# Patient Record
Sex: Female | Born: 1980 | Hispanic: No | Marital: Married
Health system: Southern US, Community
[De-identification: ages and names within clinical notes are randomized; demographics above are authoritative.]

## PROBLEM LIST (undated history)

## (undated) DIAGNOSIS — B1920 Unspecified viral hepatitis C without hepatic coma: Secondary | ICD-10-CM

## (undated) DIAGNOSIS — F329 Major depressive disorder, single episode, unspecified: Secondary | ICD-10-CM

## (undated) DIAGNOSIS — I10 Essential (primary) hypertension: Secondary | ICD-10-CM

## (undated) DIAGNOSIS — T8859XA Other complications of anesthesia, initial encounter: Secondary | ICD-10-CM

## (undated) DIAGNOSIS — T4145XA Adverse effect of unspecified anesthetic, initial encounter: Secondary | ICD-10-CM

## (undated) DIAGNOSIS — F32A Depression, unspecified: Secondary | ICD-10-CM

## (undated) DIAGNOSIS — M1990 Unspecified osteoarthritis, unspecified site: Secondary | ICD-10-CM

## (undated) DIAGNOSIS — K5903 Drug induced constipation: Secondary | ICD-10-CM

## (undated) DIAGNOSIS — F419 Anxiety disorder, unspecified: Secondary | ICD-10-CM

## (undated) DIAGNOSIS — K219 Gastro-esophageal reflux disease without esophagitis: Secondary | ICD-10-CM

## (undated) HISTORY — PX: ABDOMINAL HYSTERECTOMY: SHX81

## (undated) HISTORY — PX: CHOLECYSTECTOMY: SHX55

---

## 1998-01-18 ENCOUNTER — Emergency Department (HOSPITAL_COMMUNITY): Admission: EM | Admit: 1998-01-18 | Discharge: 1998-01-18 | Payer: Self-pay | Admitting: Emergency Medicine

## 1999-07-19 ENCOUNTER — Emergency Department (HOSPITAL_COMMUNITY): Admission: EM | Admit: 1999-07-19 | Discharge: 1999-07-19 | Payer: Self-pay

## 2000-05-18 ENCOUNTER — Encounter: Payer: Self-pay | Admitting: Emergency Medicine

## 2000-05-18 ENCOUNTER — Emergency Department (HOSPITAL_COMMUNITY): Admission: EM | Admit: 2000-05-18 | Discharge: 2000-05-19 | Payer: Self-pay | Admitting: Emergency Medicine

## 2000-05-24 ENCOUNTER — Emergency Department (HOSPITAL_COMMUNITY): Admission: EM | Admit: 2000-05-24 | Discharge: 2000-05-24 | Payer: Self-pay | Admitting: Emergency Medicine

## 2000-09-13 ENCOUNTER — Emergency Department (HOSPITAL_COMMUNITY): Admission: EM | Admit: 2000-09-13 | Discharge: 2000-09-13 | Payer: Self-pay | Admitting: Emergency Medicine

## 2000-11-24 ENCOUNTER — Encounter: Payer: Self-pay | Admitting: Emergency Medicine

## 2000-11-24 ENCOUNTER — Emergency Department (HOSPITAL_COMMUNITY): Admission: EM | Admit: 2000-11-24 | Discharge: 2000-11-24 | Payer: Self-pay | Admitting: Emergency Medicine

## 2001-01-18 ENCOUNTER — Inpatient Hospital Stay (HOSPITAL_COMMUNITY): Admission: AD | Admit: 2001-01-18 | Discharge: 2001-01-18 | Payer: Self-pay | Admitting: *Deleted

## 2001-04-28 ENCOUNTER — Inpatient Hospital Stay (HOSPITAL_COMMUNITY): Admission: AD | Admit: 2001-04-28 | Discharge: 2001-04-30 | Payer: Self-pay | Admitting: Obstetrics & Gynecology

## 2001-07-02 ENCOUNTER — Encounter: Payer: Self-pay | Admitting: Emergency Medicine

## 2001-07-02 ENCOUNTER — Emergency Department (HOSPITAL_COMMUNITY): Admission: EM | Admit: 2001-07-02 | Discharge: 2001-07-02 | Payer: Self-pay | Admitting: Emergency Medicine

## 2002-03-09 ENCOUNTER — Emergency Department (HOSPITAL_COMMUNITY): Admission: EM | Admit: 2002-03-09 | Discharge: 2002-03-09 | Payer: Self-pay | Admitting: Emergency Medicine

## 2002-04-04 ENCOUNTER — Encounter: Payer: Self-pay | Admitting: Emergency Medicine

## 2002-04-04 ENCOUNTER — Observation Stay (HOSPITAL_COMMUNITY): Admission: EM | Admit: 2002-04-04 | Discharge: 2002-04-05 | Payer: Self-pay | Admitting: Emergency Medicine

## 2002-04-04 ENCOUNTER — Emergency Department (HOSPITAL_COMMUNITY): Admission: EM | Admit: 2002-04-04 | Discharge: 2002-04-04 | Payer: Self-pay | Admitting: Emergency Medicine

## 2005-07-12 ENCOUNTER — Encounter: Payer: Self-pay | Admitting: Emergency Medicine

## 2005-07-13 ENCOUNTER — Inpatient Hospital Stay (HOSPITAL_COMMUNITY): Admission: AD | Admit: 2005-07-13 | Discharge: 2005-07-14 | Payer: Self-pay | Admitting: *Deleted

## 2005-11-02 ENCOUNTER — Ambulatory Visit (HOSPITAL_COMMUNITY): Admission: RE | Admit: 2005-11-02 | Discharge: 2005-11-02 | Payer: Self-pay | Admitting: Internal Medicine

## 2005-11-09 ENCOUNTER — Ambulatory Visit: Payer: Self-pay | Admitting: Gynecology

## 2005-11-23 ENCOUNTER — Ambulatory Visit: Payer: Self-pay | Admitting: Gynecology

## 2005-11-30 ENCOUNTER — Ambulatory Visit: Payer: Self-pay | Admitting: Gynecology

## 2012-08-29 ENCOUNTER — Emergency Department (HOSPITAL_COMMUNITY): Payer: Self-pay

## 2012-08-29 ENCOUNTER — Emergency Department (HOSPITAL_COMMUNITY)
Admission: EM | Admit: 2012-08-29 | Discharge: 2012-08-29 | Disposition: A | Discharge status: Home / Self Care | Payer: Self-pay | Attending: Emergency Medicine | Admitting: Emergency Medicine

## 2012-08-29 ENCOUNTER — Encounter (HOSPITAL_COMMUNITY): Payer: Self-pay | Admitting: Emergency Medicine

## 2012-08-29 DIAGNOSIS — Z8619 Personal history of other infectious and parasitic diseases: Secondary | ICD-10-CM | POA: Insufficient documentation

## 2012-08-29 DIAGNOSIS — R059 Cough, unspecified: Secondary | ICD-10-CM | POA: Insufficient documentation

## 2012-08-29 DIAGNOSIS — R0602 Shortness of breath: Secondary | ICD-10-CM | POA: Insufficient documentation

## 2012-08-29 DIAGNOSIS — Z79899 Other long term (current) drug therapy: Secondary | ICD-10-CM | POA: Insufficient documentation

## 2012-08-29 DIAGNOSIS — M549 Dorsalgia, unspecified: Secondary | ICD-10-CM | POA: Insufficient documentation

## 2012-08-29 DIAGNOSIS — F17200 Nicotine dependence, unspecified, uncomplicated: Secondary | ICD-10-CM | POA: Insufficient documentation

## 2012-08-29 DIAGNOSIS — IMO0002 Reserved for concepts with insufficient information to code with codable children: Secondary | ICD-10-CM | POA: Insufficient documentation

## 2012-08-29 HISTORY — DX: Unspecified viral hepatitis C without hepatic coma: B19.20

## 2012-08-29 MED ORDER — OXYCODONE-ACETAMINOPHEN 5-325 MG PO TABS
2.0000 | ORAL_TABLET | Freq: Once | ORAL | Status: AC
  Administered 2012-08-29: 2 via ORAL
  Filled 2012-08-29: qty 2

## 2012-08-29 MED ORDER — OXYCODONE-ACETAMINOPHEN 5-325 MG PO TABS: 2.0000 | ORAL_TABLET | ORAL | Status: DC | PRN

## 2012-08-29 MED ORDER — KETOROLAC TROMETHAMINE 60 MG/2ML IM SOLN
60.0000 mg | Freq: Once | INTRAMUSCULAR | Status: AC
  Administered 2012-08-29: 60 mg via INTRAMUSCULAR
  Filled 2012-08-29: qty 2

## 2012-08-29 NOTE — ED Notes (Signed)
Pt complains of right knee pain x 1 month. Pt denies any injury to area.

## 2012-08-29 NOTE — ED Notes (Signed)
MD at bedside. 

## 2012-08-29 NOTE — ED Provider Notes (Signed)
History     CSN: [id number]  Arrival date & time 08/29/12  1507   First MD Initiated Contact with Patient 08/29/12 1541      Chief Complaint  Patient presents with  . Knee Pain    (Consider location/radiation/quality/duration/timing/severity/associated sxs/prior treatment) Patient is a 32 y.o. female presenting with knee pain. The history is provided by the patient and a relative. No language interpreter was used.  Knee Pain Location:  Knee Time since incident:  12 months Injury: no   Pain details:    Quality:  Aching   Onset quality:  Gradual   Duration: several years while in jail.   Timing:  Constant   Progression:  Worsening Chronicity:  Recurrent Associated symptoms: back pain   Associated symptoms: no fever   31yo female with c/o R knee pain x several years that has worsened in the last 2 weeks.  Increased pain with bending and ambulating. She has taken nothing for the pain.  Remote history of dislocating her knee in prison 7 years ago.  Worried because no insurance or money to pay.  Also states that she gets SOB with exertion and has a cough that has been intermittant x several months. Denies chest pain or SOB at rest.  States in prison she had "spots on her lungs".  No long trips.  No calf pain.  Repeat HR 90.  No chest pain.  pmh smoker, morbidly obese, hep c.  Can not take ibuprofen (stomach ache) or tylenol (hep c).  No pcp.    Past Medical History  Diagnosis Date  . Hepatitis C     History reviewed. No pertinent past surgical history.  No family history on file.  History  Substance Use Topics  . Smoking status: Current Every Day Smoker -- 0.50 packs/day    Types: Cigarettes  . Smokeless tobacco: Not on file  . Alcohol Use: No    OB History   Grav Para Term Preterm Abortions TAB SAB Ect Mult Living                  Review of Systems  Constitutional: Negative.  Negative for fever.  HENT: Negative.   Eyes: Negative.   Respiratory: Positive for cough  and shortness of breath. Negative for chest tightness and wheezing.   Cardiovascular: Negative.  Negative for chest pain, palpitations and leg swelling.  Gastrointestinal: Negative.  Negative for nausea and vomiting.  Musculoskeletal: Positive for back pain, arthralgias and gait problem.       Knee pain.  Neurological: Negative.   Psychiatric/Behavioral: Negative.   All other systems reviewed and are negative.    Allergies  Motrin  Home Medications   Current Outpatient Rx  Name  Route  Sig  Dispense  Refill  . omeprazole (PRILOSEC) 20 MG capsule   Oral   Take 20 mg by mouth daily.           BP 117/90  Pulse 90  Temp(Src) 98.3 F (36.8 C) (Oral)  SpO2 99%  LMP 08/02/2012  Physical Exam  Nursing note and vitals reviewed. Constitutional: She is oriented to person, place, and time. She appears well-developed and well-nourished.  HENT:  Head: Normocephalic and atraumatic.  Eyes: Conjunctivae and EOM are normal. Pupils are equal, round, and reactive to light.  Neck: Normal range of motion. Neck supple.  Cardiovascular: Normal rate, regular rhythm, normal heart sounds and intact distal pulses.  Exam reveals no gallop and no friction rub.   No murmur heard.  Pulmonary/Chest: Effort normal and breath sounds normal.  Abdominal: Soft. Bowel sounds are normal.  Musculoskeletal: Normal range of motion. She exhibits tenderness. She exhibits no edema.  R knee tenderness with palpatation to patella superiorly.  No calf pain, swelling.  Skin cool to touch.  Neurological: She is alert and oriented to person, place, and time. She has normal reflexes.  Skin: Skin is warm and dry.  Psychiatric: She has a normal mood and affect.    ED Course  Procedures (including critical care time)  Labs Reviewed - No data to display Dg Chest 2 View  08/29/2012  *RADIOLOGY REPORT*  Clinical Data: Shortness of breath.  CHEST - 2 VIEW  Comparison: None.  Findings: Trachea is midline.  Heart size  normal.  Minimal scarring is seen anteriorly on the lateral view.  Lungs are otherwise clear. No pleural fluid.  IMPRESSION: No acute findings.   Original Report Authenticated By: Leanna Battles, M.D.    Dg Knee Complete 4 Views Right  08/29/2012  *RADIOLOGY REPORT*  Clinical Data: Anterior knee pain.  No known injury.  RIGHT KNEE - COMPLETE 4+ VIEW  Comparison: None.  Findings: No acute bony or joint abnormality is identified.  There is degenerative change about the knee which is advanced for age and appears worst in the patellofemoral and medial compartments.  There appear to be several loose bodies in the medial compartment.  IMPRESSION:  1.  No acute finding.  2.  Advanced for age tricompartmental degenerative change, worst in the patellofemoral and medial compartments.   Original Report Authenticated By: Holley Dexter, M.D.      No diagnosis found.    MDM  R knee pain with cough.  Knee film - for acute findings.  Morbidly obese with degenerative changes.  Crutches provided.  Cough x 1 month and SOB with exertion x several months.  No calf pain, chest pain or sob at rest.  Chest x-ray unremarkable. Hypertensive initially with small cuff.  She will follow up with ortho. She understands to return for chest pain or SOB worsening.         Remi Haggard, NP 08/29/12 2330

## 2012-08-29 NOTE — ED Notes (Signed)
Patient transported to X-ray 

## 2012-08-30 NOTE — ED Provider Notes (Signed)
Medical screening examination/treatment/procedure(s) were performed by non-physician practitioner and as supervising physician I was immediately available for consultation/collaboration.  Amonda Brillhart R. Stachia Slutsky, MD 08/30/12 0012 

## 2012-09-25 ENCOUNTER — Encounter (HOSPITAL_COMMUNITY): Payer: Self-pay | Admitting: *Deleted

## 2012-09-25 ENCOUNTER — Emergency Department (HOSPITAL_COMMUNITY)
Admission: EM | Admit: 2012-09-25 | Discharge: 2012-09-25 | Disposition: A | Discharge status: Home / Self Care | Payer: Self-pay | Attending: Emergency Medicine | Admitting: Emergency Medicine

## 2012-09-25 DIAGNOSIS — F17200 Nicotine dependence, unspecified, uncomplicated: Secondary | ICD-10-CM | POA: Insufficient documentation

## 2012-09-25 DIAGNOSIS — R112 Nausea with vomiting, unspecified: Secondary | ICD-10-CM | POA: Insufficient documentation

## 2012-09-25 DIAGNOSIS — Z8619 Personal history of other infectious and parasitic diseases: Secondary | ICD-10-CM | POA: Insufficient documentation

## 2012-09-25 DIAGNOSIS — R1110 Vomiting, unspecified: Secondary | ICD-10-CM

## 2012-09-25 DIAGNOSIS — R51 Headache: Secondary | ICD-10-CM | POA: Insufficient documentation

## 2012-09-25 DIAGNOSIS — M25569 Pain in unspecified knee: Secondary | ICD-10-CM | POA: Insufficient documentation

## 2012-09-25 LAB — COMPREHENSIVE METABOLIC PANEL
AST: 28 U/L (ref 0–37)
CO2: 24 mEq/L (ref 19–32)
Calcium: 9.2 mg/dL (ref 8.4–10.5)
Creatinine, Ser: 0.76 mg/dL (ref 0.50–1.10)
GFR calc Af Amer: >90 mL/min (ref >90)
GFR calc non Af Amer: >90 mL/min (ref >90)
Glucose, Bld: 93 mg/dL (ref 70–99)
Sodium: 135 mEq/L (ref 135–145)
Total Protein: 8.1 g/dL (ref 6.0–8.3)

## 2012-09-25 LAB — URINALYSIS, ROUTINE W REFLEX MICROSCOPIC
Ketones, ur: NEGATIVE mg/dL
Nitrite: NEGATIVE
Protein, ur: NEGATIVE mg/dL

## 2012-09-25 LAB — CBC WITH DIFFERENTIAL/PLATELET
Basophils Absolute: 0 10*3/uL (ref 0.0–0.1)
Eosinophils Absolute: 0 10*3/uL (ref 0.0–0.7)
Eosinophils Relative: 0 % (ref 0–5)
HCT: 43.2 % (ref 36.0–46.0)
Lymphocytes Relative: 31 % (ref 12–46)
MCH: 26.3 pg (ref 26.0–34.0)
MCV: 80.4 fL (ref 78.0–100.0)
Monocytes Absolute: 0.7 10*3/uL (ref 0.1–1.0)
Platelets: 289 10*3/uL (ref 150–400)
RDW: 13.9 % (ref 11.5–15.5)
WBC: 11.3 10*3/uL — ABNORMAL HIGH (ref 4.0–10.5)

## 2012-09-25 LAB — POCT PREGNANCY, URINE: Preg Test, Ur: NEGATIVE

## 2012-09-25 LAB — URINE MICROSCOPIC-ADD ON

## 2012-09-25 MED ORDER — PROMETHAZINE HCL 25 MG PO TABS: 25.0000 mg | ORAL_TABLET | Freq: Four times a day (QID) | ORAL | Status: DC | PRN

## 2012-09-25 MED ORDER — SODIUM CHLORIDE 0.9 % IV BOLUS (SEPSIS)
1000.0000 mL | Freq: Once | INTRAVENOUS | Status: AC
  Administered 2012-09-25: 1000 mL via INTRAVENOUS

## 2012-09-25 MED ORDER — ONDANSETRON HCL 4 MG/2ML IJ SOLN
4.0000 mg | Freq: Once | INTRAMUSCULAR | Status: AC
  Administered 2012-09-25: 4 mg via INTRAVENOUS
  Filled 2012-09-25: qty 2

## 2012-09-25 MED ORDER — TRAMADOL HCL 50 MG PO TABS: 50.0000 mg | ORAL_TABLET | Freq: Four times a day (QID) | ORAL | Status: DC | PRN

## 2012-09-25 NOTE — ED Provider Notes (Signed)
History     CSN: [id number]  Arrival date & time 09/25/12  1433   First MD Initiated Contact with Patient 09/25/12 1734      Chief Complaint  Patient presents with  . Knee Pain  . Emesis    (Consider location/radiation/quality/duration/timing/severity/associated sxs/prior treatment) HPI Comments: Patient comes to the ER for evaluation of nausea and vomiting. She denies abdominal pain. She has not had fever. There has not been any diarrhea. Patient reports, however, that she has not been able to hold anything down for 2 days. She has had associated mild headache. No neck pain or stiffness.  Patient also concerned about bilateral knee pain. She was seen in the ER a month ago and had an x-ray of the right knee. She reports that she was told she has arthritis, needed to see orthopedics. She reports that she has no insurance, has not been able to followup.  Patient is a 32 y.o. female presenting with knee pain and vomiting.  Knee Pain Associated symptoms: no fever   Emesis Associated symptoms: arthralgias and headaches   Associated symptoms: no abdominal pain and no diarrhea     Past Medical History  Diagnosis Date  . Hepatitis C     Past Surgical History  Procedure Laterality Date  . Cholecystectomy      No family history on file.  History  Substance Use Topics  . Smoking status: Current Every Day Smoker -- 0.50 packs/day    Types: Cigarettes  . Smokeless tobacco: Not on file  . Alcohol Use: No    OB History   Grav Para Term Preterm Abortions TAB SAB Ect Mult Living                  Review of Systems  Constitutional: Negative for fever.  Gastrointestinal: Positive for nausea and vomiting. Negative for abdominal pain and diarrhea.  Musculoskeletal: Positive for arthralgias.  Neurological: Positive for headaches.  All other systems reviewed and are negative.    Allergies  Motrin  Home Medications   Current Outpatient Rx  Name  Route  Sig  Dispense   Refill  . omeprazole (PRILOSEC) 20 MG capsule   Oral   Take 20 mg by mouth daily.           BP 146/99  Pulse 81  Temp(Src) 98.1 F (36.7 C) (Oral)  Resp 18  SpO2 100%  LMP 08/02/2012  Physical Exam  Constitutional: She is oriented to person, place, and time. She appears well-developed and well-nourished. No distress.  HENT:  Head: Normocephalic and atraumatic.  Right Ear: Hearing normal.  Nose: Nose normal.  Mouth/Throat: Oropharynx is clear and moist and mucous membranes are normal.  Eyes: Conjunctivae and EOM are normal. Pupils are equal, round, and reactive to light.  Neck: Normal range of motion. Neck supple.  Cardiovascular: Normal rate, regular rhythm, S1 normal and S2 normal.  Exam reveals no gallop and no friction rub.   No murmur heard. Pulmonary/Chest: Effort normal and breath sounds normal. No respiratory distress. She exhibits no tenderness.  Abdominal: Soft. Normal appearance and bowel sounds are normal. There is no hepatosplenomegaly. There is no tenderness. There is no rebound, no guarding, no tenderness at McBurney's point and negative Murphy's sign. No hernia.  Musculoskeletal: Normal range of motion.  Neurological: She is alert and oriented to person, place, and time. She has normal strength. No cranial nerve deficit or sensory deficit. Coordination normal. GCS eye subscore is 4. GCS verbal subscore is 5. GCS  motor subscore is 6.  Skin: Skin is warm, dry and intact. No rash noted. No cyanosis.  Psychiatric: She has a normal mood and affect. Her speech is normal and behavior is normal. Thought content normal.    ED Course  Procedures (including critical care time)  Labs Reviewed  CBC WITH DIFFERENTIAL - Abnormal; Notable for the following:    WBC 11.3 (*)    RBC 5.37 (*)    All other components within normal limits  COMPREHENSIVE METABOLIC PANEL - Abnormal; Notable for the following:    ALT 41 (*)    All other components within normal limits   URINALYSIS, ROUTINE W REFLEX MICROSCOPIC - Abnormal; Notable for the following:    APPearance CLOUDY (*)    Leukocytes, UA SMALL (*)    All other components within normal limits  URINE MICROSCOPIC-ADD ON - Abnormal; Notable for the following:    Squamous Epithelial / LPF MANY (*)    Bacteria, UA FEW (*)    All other components within normal limits  URINE CULTURE  LIPASE, BLOOD  CBC WITH DIFFERENTIAL  POCT PREGNANCY, URINE   No results found.   Diagnoses: 1. Vomiting 2. Bilateral knee arthritis    MDM  Patient comes to the ER for evaluation of nausea and vomiting. She is not experiencing abdominal pain and abdominal exam is benign. Lab work was unremarkable. Patient was hydrated and administered Zofran here in the ER. She'll be discharged with continued antibiotics.  Patient also complaining of bilateral knee pain. No swelling, redness or warmth. No concern for septic joints. Patient already diagnosed with osteoarthritis. Will recommend NSAIDs to use as needed.        Gilda Crease, MD 09/25/12 2030

## 2012-09-25 NOTE — ED Notes (Signed)
Pt here for multiple c/o chronic knee pain and emesis.  Pt was seen at [facility] for R knee pain and was told she had some "deterioration".  Was referred to orthopedic MD, but has not ins.  Since then pt now has same pain in both L and R knees.    Pt also c/o emesis and headache x 2 days. Denies diarrhea, abd pain and fevers.

## 2012-09-27 LAB — URINE CULTURE: Colony Count: >=100000

## 2012-12-24 ENCOUNTER — Emergency Department (HOSPITAL_COMMUNITY)
Admission: EM | Admit: 2012-12-24 | Discharge: 2012-12-24 | Disposition: A | Discharge status: Home / Self Care | Payer: Self-pay | Attending: Emergency Medicine | Admitting: Emergency Medicine

## 2012-12-24 DIAGNOSIS — IMO0002 Reserved for concepts with insufficient information to code with codable children: Secondary | ICD-10-CM | POA: Insufficient documentation

## 2012-12-24 DIAGNOSIS — M170 Bilateral primary osteoarthritis of knee: Secondary | ICD-10-CM

## 2012-12-24 DIAGNOSIS — Z79899 Other long term (current) drug therapy: Secondary | ICD-10-CM | POA: Insufficient documentation

## 2012-12-24 DIAGNOSIS — F17200 Nicotine dependence, unspecified, uncomplicated: Secondary | ICD-10-CM | POA: Insufficient documentation

## 2012-12-24 DIAGNOSIS — M722 Plantar fascial fibromatosis: Secondary | ICD-10-CM | POA: Insufficient documentation

## 2012-12-24 DIAGNOSIS — M1710 Unilateral primary osteoarthritis, unspecified knee: Secondary | ICD-10-CM | POA: Insufficient documentation

## 2012-12-24 DIAGNOSIS — Z8619 Personal history of other infectious and parasitic diseases: Secondary | ICD-10-CM | POA: Insufficient documentation

## 2012-12-24 MED ORDER — CYCLOBENZAPRINE HCL 10 MG PO TABS: 10.0000 mg | ORAL_TABLET | Freq: Two times a day (BID) | ORAL | Status: DC | PRN

## 2012-12-24 MED ORDER — TRAMADOL HCL 50 MG PO TABS: 50.0000 mg | ORAL_TABLET | Freq: Four times a day (QID) | ORAL | Status: DC | PRN

## 2012-12-24 NOTE — ED Provider Notes (Signed)
History  This chart was scribed for non-physician practitioner working with Gerhard Munch, MD by Greggory Stallion, ED scribe. This patient was seen in room WTR6/WTR6 and the patient's care was started at 6:30 PM.  CSN: [id number]  Arrival date & time 12/24/12  1824    No chief complaint on file.   Patient is a 32 y.o. female presenting with knee pain. The history is provided by the patient. No language interpreter was used.  Knee Pain Location:  Hip and knee Time since incident:  4 days Injury: no   Hip location:  L hip Knee location:  L knee Pain details:    Severity:  Moderate   Onset quality:  Gradual   Duration:  4 days   Timing:  Constant Chronicity:  New Dislocation: no   Relieved by:  Nothing Ineffective treatments: previous pain medication prescribed. Associated symptoms: no back pain, no fever and no neck pain     HPI Comments: Cheryl Fox is a 32 y.o. Morbidly obese female with h/o plantar fasciitis who presents to the Emergency Department complaining of gradual onset, gradually worsening bilateral knee pain that radiated from hips down to knees that started 4 days ago. Pt states the pain is worse in her left knee. Pt states she was told 4 months ago that her knee is deteriorating. She states the pain has gotten worse over the last 4 days. She states she has left heal pain with intermittent numbness. Pt states she has taken pain pills prescribed to her previously with no relief. Pt denies ankle pain, fever, neck pain, sore throat, visual disturbance, CP, cough, SOB, abdominal pain, nausea, emesis, diarrhea, urinary symptoms, back pain, HA, weakness, numbness and rash as associated symptoms.  No recent trauma.  Past Medical History  Diagnosis Date  . Hepatitis C     Past Surgical History  Procedure Laterality Date  . Cholecystectomy      No family history on file.  History  Substance Use Topics  . Smoking status: Current Every Day Smoker -- 0.50 packs/day   Types: Cigarettes  . Smokeless tobacco: Not on file  . Alcohol Use: No    OB History   Grav Para Term Preterm Abortions TAB SAB Ect Mult Living                  Review of Systems  Constitutional: Negative for fever.  HENT: Negative for sore throat and neck pain.   Eyes: Negative for visual disturbance.  Respiratory: Negative for cough and shortness of breath.   Cardiovascular: Negative for chest pain.  Gastrointestinal: Negative for nausea, vomiting and diarrhea.  Genitourinary: Negative for difficulty urinating.  Musculoskeletal: Negative for back pain.  Skin: Negative for rash.  Neurological: Negative for headaches.  All other systems reviewed and are negative.    Allergies  Motrin  Home Medications   Current Outpatient Rx  Name  Route  Sig  Dispense  Refill  . omeprazole (PRILOSEC) 20 MG capsule   Oral   Take 20 mg by mouth daily.         . promethazine (PHENERGAN) 25 MG tablet   Oral   Take 1 tablet (25 mg total) by mouth every 6 (six) hours as needed for nausea.   30 tablet   0   . traMADol (ULTRAM) 50 MG tablet   Oral   Take 1 tablet (50 mg total) by mouth every 6 (six) hours as needed for pain.   15 tablet   0  BP 140/104  Pulse 101  Temp(Src) 98.6 F (37 C) (Oral)  Resp 16  SpO2 98%  Physical Exam  Nursing note and vitals reviewed. Constitutional: She is oriented to person, place, and time. She appears well-developed and well-nourished. No distress.  HENT:  Head: Normocephalic and atraumatic.  Eyes: EOM are normal.  Neck: Neck supple. No tracheal deviation present.  Cardiovascular: Normal rate.   Pulmonary/Chest: Effort normal. No respiratory distress.  Musculoskeletal: Normal range of motion.  Normal appearence to both knees. Tenderness to palpation to interior and inferior aspects of left knee below the patella without deformity, edema, crepitance, or overlying skin changes. Normal knee flexion. No joint laxity. Normal ankle and hip.  Left foot with tenderness around the peroneal region and sole foot without any overlying changes. Increased pain with foot dorsal flexion.   Neurological: She is alert and oriented to person, place, and time.  Skin: Skin is warm and dry. No rash noted.  Psychiatric: She has a normal mood and affect. Her behavior is normal.    ED Course  Procedures (including critical care time)  DIAGNOSTIC STUDIES: Oxygen Saturation is 98% on RA, normal by my interpretation.    COORDINATION OF CARE: 7:01 PM-Discussed treatment plan which includes warm and cool compress, pain medication, and resting the left knee with pt at bedside and pt agreed to plan. Alerted pt that there was no sign of infection of the knee joint.  Labs Reviewed - No data to display No results found.   1. Osteoarthritis of both knees   2. Plantar fasciitis, left       MDM  BP 140/104  Pulse 101  Temp(Src) 98.6 F (37 C) (Oral)  Resp 16  SpO2 98%   I personally performed the services described in this documentation, which was scribed in my presence. The recorded information has been reviewed and is accurate.    Fayrene Helper, PA-C 12/24/12 1911

## 2012-12-24 NOTE — ED Provider Notes (Signed)
  Medical screening examination/treatment/procedure(s) were performed by non-physician practitioner and as supervising physician I was immediately available for consultation/collaboration.    Gerhard Munch, MD 12/24/12 [phone]

## 2012-12-24 NOTE — ED Notes (Signed)
Pt c/o bilateral knee pain, worse in L knee. Pt states pain radiates from hips down to knees. Pt states pain has been worse over the past 4 days. Pt also c/o pain to L heel and numbness to L heel. Pt ambulatory to exam room with steady gait.

## 2013-08-05 ENCOUNTER — Emergency Department (HOSPITAL_COMMUNITY): Payer: Self-pay

## 2013-08-05 ENCOUNTER — Encounter (HOSPITAL_COMMUNITY): Payer: Self-pay | Admitting: Emergency Medicine

## 2013-08-05 ENCOUNTER — Emergency Department (HOSPITAL_COMMUNITY)
Admission: EM | Admit: 2013-08-05 | Discharge: 2013-08-06 | Disposition: A | Discharge status: Home / Self Care | Payer: Self-pay | Attending: Emergency Medicine | Admitting: Emergency Medicine

## 2013-08-05 DIAGNOSIS — X58XXXA Exposure to other specified factors, initial encounter: Secondary | ICD-10-CM | POA: Insufficient documentation

## 2013-08-05 DIAGNOSIS — R000 Tachycardia, unspecified: Secondary | ICD-10-CM | POA: Insufficient documentation

## 2013-08-05 DIAGNOSIS — IMO0002 Reserved for concepts with insufficient information to code with codable children: Secondary | ICD-10-CM | POA: Insufficient documentation

## 2013-08-05 DIAGNOSIS — R071 Chest pain on breathing: Secondary | ICD-10-CM | POA: Insufficient documentation

## 2013-08-05 DIAGNOSIS — F17200 Nicotine dependence, unspecified, uncomplicated: Secondary | ICD-10-CM | POA: Insufficient documentation

## 2013-08-05 DIAGNOSIS — M542 Cervicalgia: Secondary | ICD-10-CM

## 2013-08-05 DIAGNOSIS — Z79899 Other long term (current) drug therapy: Secondary | ICD-10-CM | POA: Insufficient documentation

## 2013-08-05 DIAGNOSIS — K219 Gastro-esophageal reflux disease without esophagitis: Secondary | ICD-10-CM | POA: Insufficient documentation

## 2013-08-05 DIAGNOSIS — T148XXA Other injury of unspecified body region, initial encounter: Secondary | ICD-10-CM

## 2013-08-05 DIAGNOSIS — M546 Pain in thoracic spine: Secondary | ICD-10-CM | POA: Insufficient documentation

## 2013-08-05 DIAGNOSIS — Z880 Allergy status to penicillin: Secondary | ICD-10-CM | POA: Insufficient documentation

## 2013-08-05 DIAGNOSIS — Y929 Unspecified place or not applicable: Secondary | ICD-10-CM | POA: Insufficient documentation

## 2013-08-05 DIAGNOSIS — Y939 Activity, unspecified: Secondary | ICD-10-CM | POA: Insufficient documentation

## 2013-08-05 DIAGNOSIS — R0789 Other chest pain: Secondary | ICD-10-CM

## 2013-08-05 DIAGNOSIS — Z8619 Personal history of other infectious and parasitic diseases: Secondary | ICD-10-CM | POA: Insufficient documentation

## 2013-08-05 HISTORY — DX: Gastro-esophageal reflux disease without esophagitis: K21.9

## 2013-08-05 LAB — CBC
HCT: 42.4 % (ref 36.0–46.0)
Hemoglobin: 13.9 g/dL (ref 12.0–15.0)
MCH: 26.6 pg (ref 26.0–34.0)
MCHC: 32.8 g/dL (ref 30.0–36.0)
MCV: 81.1 fL (ref 78.0–100.0)
Platelets: 287 10*3/uL (ref 150–400)
RBC: 5.23 MIL/uL — ABNORMAL HIGH (ref 3.87–5.11)
RDW: 13.9 % (ref 11.5–15.5)
WBC: 11.3 10*3/uL — ABNORMAL HIGH (ref 4.0–10.5)

## 2013-08-05 LAB — BASIC METABOLIC PANEL
BUN: 11 mg/dL (ref 6–23)
CO2: 23 mEq/L (ref 19–32)
Calcium: 9.1 mg/dL (ref 8.4–10.5)
Chloride: 104 mEq/L (ref 96–112)
Creatinine, Ser: 0.89 mg/dL (ref 0.50–1.10)
GFR calc Af Amer: >90 mL/min (ref >90)
GFR calc non Af Amer: 84 mL/min — ABNORMAL LOW (ref >90)
Glucose, Bld: 149 mg/dL — ABNORMAL HIGH (ref 70–99)
Potassium: 4.4 mEq/L (ref 3.7–5.3)
Sodium: 139 mEq/L (ref 137–147)

## 2013-08-05 MED ORDER — IOHEXOL 350 MG/ML SOLN
100.0000 mL | Freq: Once | INTRAVENOUS | Status: AC | PRN
  Administered 2013-08-05: 100 mL via INTRAVENOUS

## 2013-08-05 NOTE — ED Notes (Signed)
Pt sates that she been having right sided chest pain that radiates to her back, neck pain for three days constant. Pt states she hurts to take a deep breath. Pt denies any injury or lifting anything to cause the pain.  Pt states that she was incarcerated couple years ago and was released last year and was told before she got out that she has spots on her right lung but hasnt seen anyone to follow up about.

## 2013-08-05 NOTE — ED Provider Notes (Signed)
CSN: [id number]     Arrival date & time 08/05/13  1847 History   First MD Initiated Contact with Patient 08/05/13 2251     Chief Complaint  Patient presents with  . Chest Pain  . back pain   . Neck Pain   (Consider location/radiation/quality/duration/timing/severity/associated sxs/prior Treatment) Patient is a 33 y.o. female presenting with chest pain and neck pain. The history is provided by the patient. No language interpreter was used.  Chest Pain Pain location:  R chest Pain quality: sharp   Pain radiates to:  Upper back Pain radiates to the back: yes   Pain severity:  Moderate Onset quality:  Gradual Duration:  3 days Timing:  Constant Context: breathing, movement and at rest   Associated symptoms: back pain   Associated symptoms: no abdominal pain, no cough, no fever, no nausea and no shortness of breath   Associated symptoms comment:  She denies cough or fever. "I can't take a deep breath because of pain". No history of PE or DVT. She is a smoker without recent travel or birth control. No N, V, D. She denies history of injury. Neck Pain Associated symptoms: chest pain   Associated symptoms: no fever     Past Medical History  Diagnosis Date  . Hepatitis C   . Acid reflux    Past Surgical History  Procedure Laterality Date  . Cholecystectomy     No family history on file. History  Substance Use Topics  . Smoking status: Current Every Day Smoker -- 0.50 packs/day    Types: Cigarettes  . Smokeless tobacco: Not on file  . Alcohol Use: No     Comment: occasion   OB History   Grav Para Term Preterm Abortions TAB SAB Ect Mult Living                 Review of Systems  Constitutional: Negative for fever.  HENT: Negative for sore throat.   Respiratory: Negative for cough and shortness of breath.        See HPI.  Cardiovascular: Positive for chest pain.  Gastrointestinal: Negative for nausea and abdominal pain.  Musculoskeletal: Positive for back pain and neck  pain.  Skin: Negative for color change.    Allergies  Celecoxib; Motrin; Naproxen; Penicillins; and Tramadol  Home Medications   Current Outpatient Rx  Name  Route  Sig  Dispense  Refill  . ranitidine (ZANTAC) 150 MG tablet   Oral   Take 150 mg by mouth 2 (two) times daily.          BP 138/96  Pulse 117  Temp(Src) 98.3 F (36.8 C) (Oral)  Resp 20  Ht 5\' 6"  (1.676 m)  Wt 287 lb (130.182 kg)  BMI 46.35 kg/m2  SpO2 92%  LMP 06/18/2013 Physical Exam  Constitutional: She is oriented to person, place, and time. She appears well-developed and well-nourished.  HENT:  Head: Normocephalic.  Neck: Normal range of motion. Neck supple.  Cardiovascular: Normal rate and regular rhythm.   Pulmonary/Chest: Effort normal and breath sounds normal. She has no wheezes. She has no rales. She exhibits tenderness.  Abdominal: Soft. Bowel sounds are normal. There is no tenderness. There is no rebound and no guarding.  Musculoskeletal: Normal range of motion.  Right paracervical tenderness.   Neurological: She is alert and oriented to person, place, and time.  Skin: Skin is warm and dry. No rash noted.  Psychiatric: She has a normal mood and affect.    ED Course  Procedures (including critical care time) Labs Review Labs Reviewed  CBC - Abnormal; Notable for the following:    WBC 11.3 (*)    RBC 5.23 (*)    All other components within normal limits  BASIC METABOLIC PANEL - Abnormal; Notable for the following:    Glucose, Bld 149 (*)    GFR calc non Af Amer 84 (*)    All other components within normal limits   Imaging Review Dg Chest 2 View  08/05/2013   CLINICAL DATA:  Chest pain  EXAM: CHEST  2 VIEW  COMPARISON:  08/29/2012  FINDINGS: The heart size and mediastinal contours are within normal limits. Both lungs are clear. The visualized skeletal structures are unremarkable.  IMPRESSION: No active cardiopulmonary disease.   Electronically Signed   By: Ruel Favorsrevor  Shick M.D.   On:  08/05/2013 20:08   DCt Angio Chest W/cm &/or Wo Cm  08/06/2013   CLINICAL DATA:  Upper back and neck pain, mild shortness of breath.  EXAM: CT ANGIOGRAPHY CHEST WITH CONTRAST  TECHNIQUE: Multidetector CT imaging of the chest was performed using the standard protocol during bolus administration of intravenous contrast. Multiplanar CT image reconstructions including MIPs were obtained to evaluate the vascular anatomy.  CONTRAST:  100mL OMNIPAQUE IOHEXOL 350 MG/ML SOLN  COMPARISON:  08/05/2013 radiograph  FINDINGS: The pulmonary arterial branch vessels are patent. The heart size is within normal limits. The aorta is of normal caliber. No intrathoracic lymphadenopathy. Upper abdominal images show nothing acute. Cholecystectomy.  Respiratory motion degrades detail lung parenchymal evaluation. The central airways are grossly patent. No pneumothorax. Mild areas of mosaic attenuation within the lung bases, in keeping with areas of atelectasis and air trapping. Mild lower lobe peribronchial thickening. No confluent airspace opacity. No suspicious nodularity.  No acute osseous finding.  Mild multilevel degenerative changes.  Review of the MIP images confirms the above findings.  IMPRESSION: No pulmonary embolism.  Mild atelectasis and areas of air-trapping within the lung bases.  Mild lower lobe peribronchial thickening. Correlate clinically for mild bronchitis.   Electronically Signed   By: Jearld LeschAndrew  DelGaizo M.D.   On: 08/06/2013 00:27   EKG Interpretation   None       MDM  No diagnosis found. Chest wall pain Neck pain Muscular strain  PE consider given painful respiration and tachycardia on arrival. Chest wall is reproducibly tender. There is also tender neck musculature. Will treat as musculoskeletal pain.    Arnoldo HookerShari A Shequilla Goodgame, PA-C 08/06/13 [phone]

## 2013-08-06 MED ORDER — MORPHINE SULFATE 4 MG/ML IJ SOLN
6.0000 mg | Freq: Once | INTRAMUSCULAR | Status: AC
  Administered 2013-08-06: 6 mg via INTRAVENOUS
  Filled 2013-08-06: qty 2

## 2013-08-06 MED ORDER — CYCLOBENZAPRINE HCL 10 MG PO TABS: 10.0000 mg | ORAL_TABLET | Freq: Two times a day (BID) | ORAL | Status: DC | PRN

## 2013-08-06 MED ORDER — HYDROCODONE-ACETAMINOPHEN 5-325 MG PO TABS: 1.0000 | ORAL_TABLET | ORAL | Status: DC | PRN

## 2013-08-06 NOTE — Discharge Instructions (Signed)
Muscle Strain  A muscle strain is an injury that occurs when a muscle is stretched beyond its normal length. Usually a small number of muscle fibers are torn when this happens. Muscle strain is rated in degrees. First-degree strains have the least amount of muscle fiber tearing and pain. Second-degree and third-degree strains have increasingly more tearing and pain.   Usually, recovery from muscle strain takes 1 2 weeks. Complete healing takes 5 6 weeks.   CAUSES   Muscle strain happens when a sudden, violent force placed on a muscle stretches it too far. This may occur with lifting, sports, or a fall.   RISK FACTORS  Muscle strain is especially common in athletes.   SIGNS AND SYMPTOMS  At the site of the muscle strain, there may be:   Pain.   Bruising.   Swelling.   Difficulty using the muscle due to pain or lack of normal function.  DIAGNOSIS   Your health care provider will perform a physical exam and ask about your medical history.  TREATMENT   Often, the best treatment for a muscle strain is resting, icing, and applying cold compresses to the injured area.   HOME CARE INSTRUCTIONS    Use the PRICE method of treatment to promote muscle healing during the first 2 3 days after your injury. The PRICE method involves:   Protecting the muscle from being injured again.   Restricting your activity and resting the injured body part.   Icing your injury. To do this, put ice in a plastic bag. Place a towel between your skin and the bag. Then, apply the ice and leave it on from 15 20 minutes each hour. After the third day, switch to moist heat packs.   Apply compression to the injured area with a splint or elastic bandage. Be careful not to wrap it too tightly. This may interfere with blood circulation or increase swelling.   Elevate the injured body part above the level of your heart as often as you can.   Only take over-the-counter or prescription medicines for pain, discomfort, or fever as directed by your  health care provider.   Warming up prior to exercise helps to prevent future muscle strains.  SEEK MEDICAL CARE IF:    You have increasing pain or swelling in the injured area.   You have numbness, tingling, or a significant loss of strength in the injured area.  MAKE SURE YOU:    Understand these instructions.   Will watch your condition.   Will get help right away if you are not doing well or get worse.  Document Released: 06/26/2005 Document Revised: 04/16/2013 Document Reviewed: 01/23/2013  ExitCare Patient Information 2014 [employer].  Chest Wall Pain  Chest wall pain is pain in or around the bones and muscles of your chest. It may take up to 6 weeks to get better. It may take longer if you must stay physically active in your work and activities.   CAUSES   Chest wall pain may happen on its own. However, it may be caused by:   A viral illness like the flu.   Injury.   Coughing.   Exercise.   Arthritis.   Fibromyalgia.   Shingles.  HOME CARE INSTRUCTIONS    Avoid overtiring physical activity. Try not to strain or perform activities that cause pain. This includes any activities using your chest or your abdominal and side muscles, especially if heavy weights are used.   Put ice on the sore   area.   Put ice in a plastic bag.   Place a towel between your skin and the bag.   Leave the ice on for 15-20 minutes per hour while awake for the first 2 days.   Only take over-the-counter or prescription medicines for pain, discomfort, or fever as directed by your caregiver.  SEEK IMMEDIATE MEDICAL CARE IF:    Your pain increases, or you are very uncomfortable.   You have a fever.   Your chest pain becomes worse.   You have new, unexplained symptoms.   You have nausea or vomiting.   You feel sweaty or lightheaded.   You have a cough with phlegm (sputum), or you cough up blood.  MAKE SURE YOU:    Understand these instructions.   Will watch your condition.   Will get help right away if you are  not doing well or get worse.  Document Released: 06/26/2005 Document Revised: 09/18/2011 Document Reviewed: 02/20/2011  ExitCare Patient Information 2014 [employer].

## 2013-08-06 NOTE — ED Provider Notes (Signed)
Medical screening examination/treatment/procedure(s) were performed by non-physician practitioner and as supervising physician I was immediately available for consultation/collaboration.  EKG Interpretation   None        Rosalin Buster, MD 08/06/13 1617 

## 2013-09-17 ENCOUNTER — Encounter (HOSPITAL_COMMUNITY): Payer: Self-pay | Admitting: Emergency Medicine

## 2013-09-17 ENCOUNTER — Emergency Department (HOSPITAL_COMMUNITY): Payer: Self-pay

## 2013-09-17 ENCOUNTER — Emergency Department (HOSPITAL_COMMUNITY)
Admission: EM | Admit: 2013-09-17 | Discharge: 2013-09-17 | Disposition: A | Discharge status: Home / Self Care | Payer: Self-pay | Attending: Emergency Medicine | Admitting: Emergency Medicine

## 2013-09-17 DIAGNOSIS — K219 Gastro-esophageal reflux disease without esophagitis: Secondary | ICD-10-CM | POA: Insufficient documentation

## 2013-09-17 DIAGNOSIS — IMO0002 Reserved for concepts with insufficient information to code with codable children: Secondary | ICD-10-CM | POA: Insufficient documentation

## 2013-09-17 DIAGNOSIS — Y9389 Activity, other specified: Secondary | ICD-10-CM | POA: Insufficient documentation

## 2013-09-17 DIAGNOSIS — S53401A Unspecified sprain of right elbow, initial encounter: Secondary | ICD-10-CM

## 2013-09-17 DIAGNOSIS — W010XXA Fall on same level from slipping, tripping and stumbling without subsequent striking against object, initial encounter: Secondary | ICD-10-CM | POA: Insufficient documentation

## 2013-09-17 DIAGNOSIS — Z880 Allergy status to penicillin: Secondary | ICD-10-CM | POA: Insufficient documentation

## 2013-09-17 DIAGNOSIS — B1920 Unspecified viral hepatitis C without hepatic coma: Secondary | ICD-10-CM | POA: Insufficient documentation

## 2013-09-17 DIAGNOSIS — Y92009 Unspecified place in unspecified non-institutional (private) residence as the place of occurrence of the external cause: Secondary | ICD-10-CM | POA: Insufficient documentation

## 2013-09-17 DIAGNOSIS — S43401A Unspecified sprain of right shoulder joint, initial encounter: Secondary | ICD-10-CM

## 2013-09-17 DIAGNOSIS — F17200 Nicotine dependence, unspecified, uncomplicated: Secondary | ICD-10-CM | POA: Insufficient documentation

## 2013-09-17 MED ORDER — OXYCODONE-ACETAMINOPHEN 5-325 MG PO TABS: 1.0000 | ORAL_TABLET | ORAL | Status: DC | PRN

## 2013-09-17 MED ORDER — OXYCODONE-ACETAMINOPHEN 5-325 MG PO TABS
1.0000 | ORAL_TABLET | Freq: Once | ORAL | Status: AC
  Administered 2013-09-17: 1 via ORAL
  Filled 2013-09-17: qty 1

## 2013-09-17 MED ORDER — HYDROMORPHONE HCL PF 1 MG/ML IJ SOLN
1.0000 mg | Freq: Once | INTRAMUSCULAR | Status: AC
  Administered 2013-09-17: 1 mg via INTRAVENOUS
  Filled 2013-09-17: qty 1

## 2013-09-17 NOTE — ED Provider Notes (Signed)
Medical screening examination/treatment/procedure(s) were performed by non-physician practitioner and as supervising physician I was immediately available for consultation/collaboration.   EKG Interpretation None       Ethelda ChickMartha K Linker, MD 09/17/13 1704

## 2013-09-17 NOTE — ED Notes (Signed)
Ortho Tech at bedside.  

## 2013-09-17 NOTE — ED Notes (Signed)
Bed: AV40WA15 Expected date:  Expected time:  Means of arrival:  Comments: EMS- fall, shoulder pain

## 2013-09-17 NOTE — ED Provider Notes (Signed)
CSN: [id number]     Arrival date & time 09/17/13  1440 History   First MD Initiated Contact with Patient 09/17/13 1505     Chief Complaint  Patient presents with  . Fall  . Shoulder Injury     (Consider location/radiation/quality/duration/timing/severity/associated sxs/prior Treatment) HPI Cheryl Fox is a 33 y.o. female who presents to emergency department after a fall. Patient states that she slipped on the back, and landed onto the right shoulder. She reports pain in the right shoulder and elbow. She was splinted by EMS, brought to emergency department. She reports "burning sensation" to the right arm. Denies any numbness or weakness in her hand. Denies hitting her head. No other injuries. She admits to "clavicle dislocation" in that same shoulder several years ago. She is otherwise healthy with no medical problems. She was given of fentanyl by EMS.   Past Medical History  Diagnosis Date  . Hepatitis C   . Acid reflux    Past Surgical History  Procedure Laterality Date  . Cholecystectomy     No family history on file. History  Substance Use Topics  . Smoking status: Current Every Day Smoker -- 0.50 packs/day    Types: Cigarettes  . Smokeless tobacco: Not on file  . Alcohol Use: No     Comment: occasion   OB History   Grav Para Term Preterm Abortions TAB SAB Ect Mult Living                 Review of Systems  Constitutional: Negative for fever and chills.  Respiratory: Negative for cough, chest tightness and shortness of breath.   Cardiovascular: Negative for chest pain, palpitations and leg swelling.  Gastrointestinal: Negative for nausea, vomiting, abdominal pain and diarrhea.  Musculoskeletal: Positive for arthralgias and joint swelling. Negative for myalgias, neck pain and neck stiffness.  Skin: Negative for rash.  Neurological: Negative for dizziness, syncope, weakness and headaches.  All other systems reviewed and are negative.      Allergies   Celecoxib; Motrin; Penicillins; Tramadol; and Naproxen  Home Medications   Current Outpatient Rx  Name  Route  Sig  Dispense  Refill  . omeprazole (PRILOSEC) 20 MG capsule   Oral   Take 20 mg by mouth 2 (two) times daily.          BP 160/108  Pulse 88  Temp(Src) 98 F (36.7 C) (Oral)  Resp 20  SpO2 95%  LMP 08/25/2013 Physical Exam  Nursing note and vitals reviewed. Constitutional: She appears well-developed and well-nourished. No distress.  HENT:  Head: Normocephalic and atraumatic.  Right Ear: External ear normal.  Left Ear: External ear normal.  Nose: Nose normal.  Mouth/Throat: Oropharynx is clear and moist.  Eyes: Conjunctivae and EOM are normal. Pupils are equal, round, and reactive to light.  Neck: Normal range of motion. Neck supple.  No cervical spine tenderness  Cardiovascular: Normal rate, regular rhythm and normal heart sounds.   Pulmonary/Chest: Effort normal and breath sounds normal. No respiratory distress. She has no wheezes. She has no rales.  Abdominal: Soft. Bowel sounds are normal. She exhibits no distension. There is no tenderness. There is no rebound.  Musculoskeletal: She exhibits no edema.  No thoracic or lumbar spine tenderness. Tender over right clavicle and right anterior and posterior shoulder joint. Unable to move due to pain. No obvious deformity. Tender over right elbow joint over bilateral epicondyles and olecranon. Pain with ROM of right elbow. Normal hand and wrist. Distal radial  pulses intact. Normal sensation over deltoid and all dermatomes in the hand.  Neurological: She is alert.  Skin: Skin is warm and dry.  Psychiatric: She has a normal mood and affect. Her behavior is normal.    ED Course  Procedures (including critical care time) Labs Review Labs Reviewed - No data to display Imaging Review Dg Clavicle Right  09/17/2013   CLINICAL DATA Fall  EXAM RIGHT CLAVICLE - 2+ VIEWS  COMPARISON None.  FINDINGS There is no evidence of  fracture or other focal bone lesions. Soft tissues are unremarkable.  IMPRESSION Negative.  SIGNATURE  Electronically Signed   By: Marlan Palauharles  Clark M.D.   On: 09/17/2013 15:35     EKG Interpretation None      MDM   Final diagnoses:  Sprain of elbow, right  Sprain of shoulder, right    Pt with mechanical fall, right arm pain and injury. X-rays negative. Neurovascularly intact. Home with percocet, ibuprofen, sling. RICE therapy at home. Follow up with orthopedics, referral given. PT agrees with the plan.   Filed Vitals:   09/17/13 1441  BP: 160/108  Pulse: 88  Temp: 98 F (36.7 C)  TempSrc: Oral  Resp: 20  SpO2: 95%     Ramesh Moan A Tawonda Legaspi, PA-C 09/17/13 1700

## 2013-09-17 NOTE — Progress Notes (Signed)
[facility] CL provided pt with a list of primary care resources and [facility]. CL set patient up with an apt for [facility] [place] on 3/26 at 12:15 pm.

## 2013-09-17 NOTE — ED Notes (Signed)
She states her shoe became stuck on her front stoop, causing her to trip and fall.  She c/o severe right shoulder area pain; and received of Fentanyl for same while en route to hospital.  She is awake, alert and oriented x 4 on arrival to ED.  She has a malleable splint on right arm and it is propped on a pillow.  CMS intact all fingers bilat.

## 2013-09-17 NOTE — Discharge Instructions (Signed)
Your x-rays were normal today. Ice your elbow and your shoulder several times a day. Keep sling on. Avoid using right arm. Take pain medications as prescribed. Follow up with orthopedics specialist.    Shoulder Sprain A shoulder sprain is the result of damage to the tough, fiber-like tissues (ligaments) that help hold your shoulder in place. The ligaments may be stretched or torn. Besides the main shoulder joint (the ball and socket), there are several smaller joints that connect the bones in this area. A sprain usually involves one of those joints. Most often it is the acromioclavicular (or AC) joint. That is the joint that connects the collarbone (clavicle) and the shoulder blade (scapula) at the top point of the shoulder blade (acromion). A shoulder sprain is a mild form of what is called a shoulder separation. Recovering from a shoulder sprain may take some time. For some, pain lingers for several months. Most people recover without long term problems. CAUSES   A shoulder sprain is usually caused by some kind of trauma. This might be:  Falling on an outstretched arm.  Being hit hard on the shoulder.  Twisting the arm.  Shoulder sprains are more likely to occur in people who:  Play sports.  Have balance or coordination problems. SYMPTOMS   Pain when you move your shoulder.  Limited ability to move the shoulder.  Swelling and tenderness on top of the shoulder.  Redness or warmth in the shoulder.  Bruising.  A change in the shape of the shoulder. DIAGNOSIS  Your healthcare provider may:  Ask about your symptoms.  Ask about recent activity that might have caused those symptoms.  Examine your shoulder. You may be asked to do simple exercises to test movement. The other shoulder will be examined for comparison.  Order some tests that provide a look inside the body. They can show the extent of the injury. The tests could include:  X-rays.  CT (computed tomography)  scan.  MRI (magnetic resonance imaging) scan. RISKS AND COMPLICATIONS  Loss of full shoulder motion.  Ongoing shoulder pain. TREATMENT  How long it takes to recover from a shoulder sprain depends on how severe it was. Treatment options may include:  Rest. You should not use the arm or shoulder until it heals.  Ice. For 2 or 3 days after the injury, put an ice pack on the shoulder up to 4 times a day. It should stay on for 15 to 20 minutes each time. Wrap the ice in a towel so it does not touch your skin.  Over-the-counter medicine to relieve pain.  A sling or brace. This will keep the arm still while the shoulder is healing.  Physical therapy or rehabilitation exercises. These will help you regain strength and motion. Ask your healthcare provider when it is OK to begin these exercises.  Surgery. The need for surgery is rare with a sprained shoulder, but some people may need surgery to keep the joint in place and reduce pain. HOME CARE INSTRUCTIONS   Ask your healthcare provider about what you should and should not do while your shoulder heals.  Make sure you know how to apply ice to the correct area of your shoulder.  Talk with your healthcare provider about which medications should be used for pain and swelling.  If rehabilitation therapy will be needed, ask your healthcare provider to refer you to a therapist. If it is not recommended, then ask about at-home exercises. Find out when exercise should begin. SEEK MEDICAL  CARE IF:  Your pain, swelling, or redness at the joint increases. SEEK IMMEDIATE MEDICAL CARE IF:   You have a fever.  You cannot move your arm or shoulder. Document Released: 11/12/2008 Document Revised: 09/18/2011 Document Reviewed: 11/12/2008 [facility] Patient Information 2014 [place], [place].

## 2013-09-20 ENCOUNTER — Emergency Department (HOSPITAL_COMMUNITY)
Admission: EM | Admit: 2013-09-20 | Discharge: 2013-09-20 | Disposition: A | Discharge status: Home / Self Care | Payer: Self-pay | Attending: Emergency Medicine | Admitting: Emergency Medicine

## 2013-09-20 ENCOUNTER — Encounter (HOSPITAL_COMMUNITY): Payer: Self-pay | Admitting: Emergency Medicine

## 2013-09-20 DIAGNOSIS — K219 Gastro-esophageal reflux disease without esophagitis: Secondary | ICD-10-CM | POA: Insufficient documentation

## 2013-09-20 DIAGNOSIS — S46909A Unspecified injury of unspecified muscle, fascia and tendon at shoulder and upper arm level, unspecified arm, initial encounter: Secondary | ICD-10-CM | POA: Insufficient documentation

## 2013-09-20 DIAGNOSIS — W2209XA Striking against other stationary object, initial encounter: Secondary | ICD-10-CM | POA: Insufficient documentation

## 2013-09-20 DIAGNOSIS — S4980XA Other specified injuries of shoulder and upper arm, unspecified arm, initial encounter: Secondary | ICD-10-CM | POA: Insufficient documentation

## 2013-09-20 DIAGNOSIS — F17200 Nicotine dependence, unspecified, uncomplicated: Secondary | ICD-10-CM | POA: Insufficient documentation

## 2013-09-20 DIAGNOSIS — Z8619 Personal history of other infectious and parasitic diseases: Secondary | ICD-10-CM | POA: Insufficient documentation

## 2013-09-20 DIAGNOSIS — Y9289 Other specified places as the place of occurrence of the external cause: Secondary | ICD-10-CM | POA: Insufficient documentation

## 2013-09-20 DIAGNOSIS — Y939 Activity, unspecified: Secondary | ICD-10-CM | POA: Insufficient documentation

## 2013-09-20 DIAGNOSIS — Z880 Allergy status to penicillin: Secondary | ICD-10-CM | POA: Insufficient documentation

## 2013-09-20 DIAGNOSIS — Z79899 Other long term (current) drug therapy: Secondary | ICD-10-CM | POA: Insufficient documentation

## 2013-09-20 DIAGNOSIS — M25519 Pain in unspecified shoulder: Secondary | ICD-10-CM

## 2013-09-20 MED ORDER — OXYCODONE-ACETAMINOPHEN 5-325 MG PO TABS: 1.0000 | ORAL_TABLET | ORAL | Status: DC | PRN

## 2013-09-20 NOTE — ED Notes (Signed)
She states she fell and was seen here for same this Wed.  She is wearing a right arm sling.  She states that upon her mother entering her bathroom, she was inadvertently struck by the door as she opened it.  She is in no distress.

## 2013-09-20 NOTE — Discharge Instructions (Signed)
Sprain A sprain is a tear in one of the strong, fibrous tissues that connect your bones (ligaments). The severity of the sprain depends on how much of the ligament is torn. The tear can be either partial or complete. CAUSES  Often, sprains are a result of a fall or an injury. The force of the impact causes the fibers of your ligament to stretch beyond their normal length. This excess tension causes the fibers of your ligament to tear. SYMPTOMS  You may have some loss of motion or increased pain within your normal range of motion. Other symptoms include:  Bruising.  Tenderness.  Swelling. DIAGNOSIS  In order to diagnose a sprain, your caregiver will physically examine you to determine how torn the ligament is. Your caregiver may also suggest an X-ray exam to make sure no bones are broken. TREATMENT  If your ligament is only partially torn, treatment usually involves keeping the injured area in a fixed position (immobilization) for a short period. To do this, your caregiver will apply a bandage, cast, or splint to keep the area from moving until it heals. For a partially torn ligament, the healing process usually takes 2 to 3 weeks. If your ligament is completely torn, you may need surgery to reconnect the ligament to the bone or to reconstruct the ligament. After surgery, a cast or splint may be applied and will need to stay on for 4 to 6 weeks while your ligament heals. HOME CARE INSTRUCTIONS  Keep the injured area elevated to decrease swelling.  To ease pain and swelling, apply ice to your joint twice a day, for 2 to 3 days.  Put ice in a plastic bag.  Place a towel between your skin and the bag.  Leave the ice on for 15 minutes.  Only take over-the-counter or prescription medicine for pain as directed by your caregiver.  Do not leave the injured area unprotected until pain and stiffness go away (usually 3 to 4 weeks).  Do not allow your cast or splint to get wet. Cover your cast or  splint with a plastic bag when you shower or bathe. Do not swim.  Your caregiver may suggest exercises for you to do during your recovery to prevent or limit permanent stiffness. SEEK IMMEDIATE MEDICAL CARE IF:  Your cast or splint becomes damaged.  Your pain becomes worse. MAKE SURE YOU:  Understand these instructions.  Will watch your condition.  Will get help right away if you are not doing well or get worse. Document Released: 06/23/2000 Document Revised: 09/18/2011 Document Reviewed: 07/08/2011 [facility] Patient Information 2014 [place], [place]. Acetaminophen; Oxycodone tablets What is this medicine? ACETAMINOPHEN; OXYCODONE (a set a MEE noe fen; ox i KOE done) is a pain reliever. It is used to treat mild to moderate pain. This medicine may be used for other purposes; ask your health care provider or pharmacist if you have questions. COMMON BRAND NAME(S): Endocet, Magnacet, Narvox, Percocet, Perloxx, Primalev, Primlev, Roxicet, Xolox What should I tell my health care provider before I take this medicine? They need to know if you have any of these conditions: -brain tumor -Crohn's disease, inflammatory bowel disease, or ulcerative colitis -drug abuse or addiction -head injury -heart or circulation problems -if you often drink alcohol -kidney disease or problems going to the bathroom -liver disease -lung disease, asthma, or breathing problems -an unusual or allergic reaction to acetaminophen, oxycodone, other opioid analgesics, other medicines, foods, dyes, or preservatives -pregnant or trying to get pregnant -breast-feeding How should  I use this medicine? Take this medicine by mouth with a full glass of water. Follow the directions on the prescription label. Take your medicine at regular intervals. Do not take your medicine more often than directed. Talk to your pediatrician regarding the use of this medicine in children. Special care may be needed. Patients over 15 years  old may have a stronger reaction and need a smaller dose. Overdosage: If you think you have taken too much of this medicine contact a poison control center or emergency room at once. NOTE: This medicine is only for you. Do not share this medicine with others. What if I miss a dose? If you miss a dose, take it as soon as you can. If it is almost time for your next dose, take only that dose. Do not take double or extra doses. What may interact with this medicine? -alcohol -antihistamines -barbiturates like amobarbital, butalbital, butabarbital, methohexital, pentobarbital, phenobarbital, thiopental, and secobarbital -benztropine -drugs for bladder problems like solifenacin, trospium, oxybutynin, tolterodine, hyoscyamine, and methscopolamine -drugs for breathing problems like ipratropium and tiotropium -drugs for certain stomach or intestine problems like propantheline, homatropine methylbromide, glycopyrrolate, atropine, belladonna, and dicyclomine -general anesthetics like etomidate, ketamine, nitrous oxide, propofol, desflurane, enflurane, halothane, isoflurane, and sevoflurane -medicines for depression, anxiety, or psychotic disturbances -medicines for sleep -muscle relaxants -naltrexone -narcotic medicines (opiates) for pain -phenothiazines like perphenazine, thioridazine, chlorpromazine, mesoridazine, fluphenazine, prochlorperazine, promazine, and trifluoperazine -scopolamine -tramadol -trihexyphenidyl This list may not describe all possible interactions. Give your health care provider a list of all the medicines, herbs, non-prescription drugs, or dietary supplements you use. Also tell them if you smoke, drink alcohol, or use illegal drugs. Some items may interact with your medicine. What should I watch for while using this medicine? Tell your doctor or health care professional if your pain does not go away, if it gets worse, or if you have new or a different type of pain. You may develop  tolerance to the medicine. Tolerance means that you will need a higher dose of the medication for pain relief. Tolerance is normal and is expected if you take this medicine for a long time. Do not suddenly stop taking your medicine because you may develop a severe reaction. Your body becomes used to the medicine. This does NOT mean you are addicted. Addiction is a behavior related to getting and using a drug for a non-medical reason. If you have pain, you have a medical reason to take pain medicine. Your doctor will tell you how much medicine to take. If your doctor wants you to stop the medicine, the dose will be slowly lowered over time to avoid any side effects. You may get drowsy or dizzy. Do not drive, use machinery, or do anything that needs mental alertness until you know how this medicine affects you. Do not stand or sit up quickly, especially if you are an older patient. This reduces the risk of dizzy or fainting spells. Alcohol may interfere with the effect of this medicine. Avoid alcoholic drinks. There are different types of narcotic medicines (opiates) for pain. If you take more than one type at the same time, you may have more side effects. Give your health care provider a list of all medicines you use. Your doctor will tell you how much medicine to take. Do not take more medicine than directed. Call emergency for help if you have problems breathing. The medicine will cause constipation. Try to have a bowel movement at least every 2 to 3 days.  If you do not have a bowel movement for 3 days, call your doctor or health care professional. Do not take Tylenol (acetaminophen) or medicines that have acetaminophen with this medicine. Too much acetaminophen can be very dangerous. Many nonprescription medicines contain acetaminophen. Always read the labels carefully to avoid taking more acetaminophen. What side effects may I notice from receiving this medicine? Side effects that you should report to your  doctor or health care professional as soon as possible: -allergic reactions like skin rash, itching or hives, swelling of the face, lips, or tongue -breathing difficulties, wheezing -confusion -light headedness or fainting spells -severe stomach pain -unusually weak or tired -yellowing of the skin or the whites of the eyes  Side effects that usually do not require medical attention (report to your doctor or health care professional if they continue or are bothersome): -dizziness -drowsiness -nausea -vomiting This list may not describe all possible side effects. Call your doctor for medical advice about side effects. You may report side effects to FDA at 1-800-FDA-1088. Where should I keep my medicine? Keep out of the reach of children. This medicine can be abused. Keep your medicine in a safe place to protect it from theft. Do not share this medicine with anyone. Selling or giving away this medicine is dangerous and against the law. Store at room temperature between 20 and 25 degrees C (68 and 77 degrees F). Keep container tightly closed. Protect from light. This medicine may cause accidental overdose and death if it is taken by other adults, children, or pets. Flush any unused medicine down the toilet to reduce the chance of harm. Do not use the medicine after the expiration date. NOTE: This sheet is a summary. It may not cover all possible information. If you have questions about this medicine, talk to your doctor, pharmacist, or health care provider.  2014, [employer]. (2013-02-17 13:17:35)
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
## 2013-09-21 NOTE — ED Provider Notes (Signed)
Medical screening examination/treatment/procedure(s) were performed by non-physician practitioner and as supervising physician I was immediately available for consultation/collaboration.   EKG Interpretation None        David H Yao, MD 09/21/13 2104 

## 2013-11-11 ENCOUNTER — Encounter (HOSPITAL_COMMUNITY): Payer: Self-pay | Admitting: Emergency Medicine

## 2013-11-11 ENCOUNTER — Emergency Department (HOSPITAL_COMMUNITY)
Admission: EM | Admit: 2013-11-11 | Discharge: 2013-11-11 | Disposition: A | Discharge status: Home / Self Care | Payer: Self-pay | Attending: Emergency Medicine | Admitting: Emergency Medicine

## 2013-11-11 ENCOUNTER — Emergency Department (HOSPITAL_COMMUNITY): Payer: Self-pay

## 2013-11-11 DIAGNOSIS — Z8619 Personal history of other infectious and parasitic diseases: Secondary | ICD-10-CM | POA: Insufficient documentation

## 2013-11-11 DIAGNOSIS — M6520 Calcific tendinitis, unspecified site: Secondary | ICD-10-CM

## 2013-11-11 DIAGNOSIS — M25511 Pain in right shoulder: Secondary | ICD-10-CM

## 2013-11-11 DIAGNOSIS — Z880 Allergy status to penicillin: Secondary | ICD-10-CM | POA: Insufficient documentation

## 2013-11-11 DIAGNOSIS — Z79899 Other long term (current) drug therapy: Secondary | ICD-10-CM | POA: Insufficient documentation

## 2013-11-11 DIAGNOSIS — M7530 Calcific tendinitis of unspecified shoulder: Secondary | ICD-10-CM | POA: Insufficient documentation

## 2013-11-11 DIAGNOSIS — F17200 Nicotine dependence, unspecified, uncomplicated: Secondary | ICD-10-CM | POA: Insufficient documentation

## 2013-11-11 DIAGNOSIS — K219 Gastro-esophageal reflux disease without esophagitis: Secondary | ICD-10-CM | POA: Insufficient documentation

## 2013-11-11 DIAGNOSIS — G8911 Acute pain due to trauma: Secondary | ICD-10-CM | POA: Insufficient documentation

## 2013-11-11 MED ORDER — PROMETHAZINE HCL 25 MG PO TABS: 25.0000 mg | ORAL_TABLET | Freq: Four times a day (QID) | ORAL | Status: DC | PRN

## 2013-11-11 MED ORDER — OXYCODONE-ACETAMINOPHEN 5-325 MG PO TABS: 1.0000 | ORAL_TABLET | Freq: Four times a day (QID) | ORAL | Status: DC | PRN

## 2013-11-11 MED ORDER — ONDANSETRON 8 MG PO TBDP
8.0000 mg | ORAL_TABLET | Freq: Once | ORAL | Status: AC
  Administered 2013-11-11: 8 mg via ORAL
  Filled 2013-11-11: qty 1

## 2013-11-11 MED ORDER — OXYCODONE-ACETAMINOPHEN 5-325 MG PO TABS
2.0000 | ORAL_TABLET | Freq: Once | ORAL | Status: AC
  Administered 2013-11-11: 2 via ORAL
  Filled 2013-11-11: qty 2
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
## 2013-11-11 NOTE — Discharge Instructions (Signed)
Shoulder Range of Motion Exercises °The shoulder is the most flexible joint in the human body. Because of this it is also the most unstable joint in the body. All ages can develop shoulder problems. Early treatment of problems is necessary for a good outcome. People react to shoulder pain by decreasing the movement of the joint. After a brief period of time, the shoulder can become "frozen". This is an almost complete loss of the ability to move the damaged shoulder. Following injuries your caregivers can give you instructions on exercises to keep your range of motion (ability to move your shoulder freely), or regain it if it has been lost.  °EXERCISES °EXERCISES TO MAINTAIN THE MOBILITY OF YOUR SHOULDER: °Codman's Exercise or Pendulum Exercise °· This exercise may be performed in a prone (face-down) lying position or standing while leaning on a chair with the opposite arm. Its purpose is to relax the muscles in your shoulder and slowly but surely increase the range of motion and to relieve pain. °· Lie on your stomach close to the side edge of the bed. Let your weak arm hang over the edge of the bed. Relax your shoulder, arm and hand. Let your shoulder blade relax and drop down. °· Slowly and gently swing your arm forward and back. Do not use your neck muscles; relax them. It might be easier to have someone else gently start swinging your arm. °· As pain decreases, increase your swing. To start, arm swing should begin at 15 degree angles. In time and as pain lessens, move to 30-45 degree angles. Start with swinging for about 15 seconds, and work towards swinging for 3 to 5 minutes. °· This exercise may also be performed in a standing/bent over position. °· Stand and hold onto a sturdy chair with your good arm. Bend forward at the waist and bend your knees slightly to help protect your back. Relax your weak arm, let it hang limp. Relax your shoulder blade and let it drop. °· Keep your shoulder relaxed and use body  motion to swing your arm in small circles. °· Stand up tall and relax. °· Repeat motion and change direction of circles. °· Start with swinging for about 30 seconds, and work towards swinging for 3 to 5 minutes. °STRETCHING EXERCISES: °· Lift your arm out in front of you with the elbow bent at 90 degrees. Using your other arm gently pull the elbow forward and across your body. °· Bend one arm behind you with the palm facing outward. Using the other arm, hold a towel or rope and reach this arm up above your head, then bend it at the elbow to move your wrist to behind your neck. Grab the free end of the towel with the hand behind your back. Gently pull the towel up with the hand behind your neck, gradually increasing the pull on the hand behind the small of your back. Then, gradually pull down with the hand behind the small of your back. This will pull the hand and arm behind your neck further. Both shoulders will have an increased range of motion with repetition of this exercise. °STRENGTHENING EXERCISES: °· Standing with your arm at your side and straight out from your shoulder with the elbow bent at 90 degrees, hold onto a small weight and slowly raise your hand so it points straight up in the air. Repeat this five times to begin with, and gradually increase to ten times. Do this four times per day. As you grow   stronger you can gradually increase the weight.  Repeat the above exercise, only this time using an elastic band. Start with your hand up in the air and pull down until your hand is by your side. As you grow stronger, gradually increase the amount you pull by increasing the number or size of the elastic bands. Use the same amount of repetitions.  Standing with your hand at your side and holding onto a weight, gradually lift the hand in front of you until it is over your head. Do the same also with the hand remaining at your side and lift the hand away from your body until it is again over your head.  Repeat this five times to begin with, and gradually increase to ten times. Do this four times per day. As you grow stronger you can gradually increase the weight. Document Released: 03/25/2003 Document Revised: 09/18/2011 Document Reviewed: 06/26/2005 [facility] Patient Information 2014 [place], [place].  Rotator Cuff Tendinitis  Rotator cuff tendinitis is inflammation of the tough, cord-like bands that connect muscle to bone (tendons) in your rotator cuff. Your rotator cuff is the collection of all the muscles and tendons that connect your arm to your shoulder. Your rotator cuff holds the head of your upper arm bone (humerus) in the cup (fossa) of your shoulder blade (scapula). CAUSES Rotator cuff tendinitis is usually caused by overusing the joint involved.  SIGNS AND SYMPTOMS  Deep ache in the shoulder also felt on the outside upper arm over the shoulder muscle.  Point tenderness over the area that is injured.  Pain comes on gradually and becomes worse with lifting the arm to the side (abduction) or turning it inward (internal rotation).  May lead to a chronic tear: When a rotator cuff tendon becomes inflamed, it runs the risk of losing its blood supply, causing some tendon fibers to die. This increases the risk that the tendon can fray and partially or completely tear. DIAGNOSIS Rotator cuff tendinitis is diagnosed by taking a medical history, performing a physical exam, and reviewing results of imaging exams. The medical history is useful to help determine the type of rotator cuff injury. The physical exam will include looking at the injured shoulder, feeling the injured area, and watching you do range-of-motion exercises. X-ray exams are typically done to rule out other causes of shoulder pain, such as fractures. MRI is the imaging exam usually used for significant shoulder injuries. Sometimes a dye study called CT arthrogram is done, but it is not as widely used as MRI. In some institutions,  special ultrasound tests may also be used to aid in the diagnosis. TREATMENT  Less Severe Cases  Use of a sling to rest the shoulder for a short period of time. Prolonged use of the sling can cause stiffness, weakness, and loss of motion of the shoulder joint.  Anti-inflammatory medicines, such as ibuprofen or naproxen sodium, may be prescribed. More Severe Cases  Physical therapy.  Use of steroid injections into the shoulder joint.  Surgery. HOME CARE INSTRUCTIONS   Use a sling or splint until the pain decreases. Prolonged use of the sling can cause stiffness, weakness, and loss of motion of the shoulder joint.  Apply ice to the injured area:  Put ice in a plastic bag.  Place a towel between your skin and the bag.  Leave the ice on for 20 minutes, 2 3 times a day.  Try to avoid use other than gentle range of motion while your shoulder is painful. Use  the shoulder and exercise only as directed by your health care provider. Stop exercises or range of motion if pain or discomfort increases, unless directed otherwise by your health care provider.  Only take over-the-counter or prescription medicines for pain, discomfort, or fever as directed by your health care provider.  If you were given a shoulder sling and straps (immobilizer), do not remove it except as directed, or until you see a health care provider for a follow-up exam. If you need to remove it, move your arm as little as possible or as directed.  You may want to sleep on several pillows at night to lessen swelling and pain. SEEK IMMEDIATE MEDICAL CARE IF:   Your shoulder pain increases or new pain develops in your arm, hand, or fingers and is not relieved with medicines.  You have new, unexplained symptoms, especially increased numbness in the hands or loss of strength.  You develop any worsening of the problems that brought you in for care.  Your arm, hand, or fingers are numb or tingling.  Your arm, hand, or  fingers are swollen, painful, or turn white or blue. MAKE SURE YOU:  Understand these instructions.  Will watch your condition.  Will get help right away if you are not doing well or get worse. Document Released: 09/16/2003 Document Revised: 04/16/2013 Document Reviewed: 02/05/2013 [facility] Patient Information 2014 [place].  Calcific Tendinitis Calcific tendinitis occurs when crystals of calcium are deposited in a tendon. Tendons are bands of strong, fibrous tissue that attach muscles to bones. Tendons are an important part of joints. They make the joint move and they absorb some of the stress that a joint receives during use. When calcium is deposited in the tendon, the tendon becomes stiff, painful, and it can become swollen. Calcific tendinitis occurs frequently in the shoulder joint, in a structure called the rotator cuff. CAUSES  The cause of calcific tendinitis is unclear. It may be associated with:  Overuse of the tendon, such as from repetitive motion.  Excess stress on the tendon.  Aging.  Repetitive, mild injuries. SYMPTOMS   Pain may or may not be present. If it is present, it may occur when moving the joint.  Tenderness when pressure is applied to the tendon.  A snapping or popping sound when the joint moves.  Decreased motion of the joint.  Difficulty sleeping due to pain in the joint. DIAGNOSIS  Your caregiver will perform a physical exam. Imaging tests may also be used to make the diagnosis. These may include X-rays, an MRI, or a CT scan. TREATMENT  Generally, calcific tendinitis resolves on its own. Treatment for pain of calcific tendinitis may include:  Taking over-the-counter medicines, such as anti-inflammatory drugs.  Applying ice packs to the joint.  Following a specific exercise program to keep the joint working properly.  Attending physical therapy sessions.  Avoiding activities that cause pain. Treatment for more severe calcific  tendinitis may require:  Injecting cortisone steroids or pain relieving medicines into or around the joint.  Manipulating the joint after you are given medicine to numb the area (local anesthetic).  Inflating the joint with sterile fluid to increase the flexibility of the tendons.  Shockwave therapy, which involves focusing sounds waves on the joint. If other treatments do not work, surgery may be done to clean out the calcium deposits and repair the tendons where needed. Most people do not need surgery. HOME CARE INSTRUCTIONS   Only take over-the-counter or prescription medicines for pain, fever, or  discomfort as directed by your caregiver.  Follow your caregiver's recommendations for activity and exercise. SEEK MEDICAL CARE IF:  You notice an increase in pain or numbness.  You develop new weakness.  You notice increased joint stiffness or a sensation of looseness in the joint.  You notice increasing redness, swelling, or warmth around the joint area. SEEK IMMEDIATE MEDICAL CARE IF:  You have a fever or persistent symptoms for more than 2 to 3 days.  You have a fever and your symptoms suddenly get worse. MAKE SURE YOU:  Understand these instructions.  Will watch your condition.  Will get help right away if you are not doing well or get worse. Document Released: 04/04/2008 Document Revised: 12/26/2011 Document Reviewed: 10/05/2011 [facility] Patient Information 2014 [place], [place].

## 2013-11-11 NOTE — ED Notes (Signed)
Pt had a fall in March hurting right shoulder; no fractures at that time; referred to orthopedist but didn't go; continues to c/o right shoulder pain

## 2013-11-11 NOTE — ED Provider Notes (Signed)
CSN: [id number]     Arrival date & time 11/11/13  [id number] History   First MD Initiated Contact with Patient 11/11/13 1010     Chief Complaint  Patient presents with  . Shoulder Pain     (Consider location/radiation/quality/duration/timing/severity/associated sxs/prior Treatment) HPI Comments: Patient is a 33 year old female with history of hepatitis C and acid reflux who presents to the emergency department with persistent right shoulder pain after a fall on an outstretched arm in March. She was initially evaluated here and told there was no fracture seen on the xray. Her provider told her that it is possible she has damage to her shoulder not seen on xray, placed her in a sling and told her to follow up with orthopedics. The patient does not have insurance and was unable to do this. She took the pain medication prescribed at that point and has been using Motrin without relief. She is experiencing a burning pain to her shoulder worse with movement. Now she is unable to lift her right arm above her head. No fevers, chills, nausea, vomiting, chest pain, shortness of breath, numbness, or tingling.    Patient is a 33 y.o. female presenting with shoulder pain. The history is provided by the patient. No language interpreter was used.  Shoulder Pain Associated symptoms include arthralgias and myalgias. Pertinent negatives include no chest pain, chills or fever.    Past Medical History  Diagnosis Date  . Hepatitis C   . Acid reflux    Past Surgical History  Procedure Laterality Date  . Cholecystectomy     No family history on file. History  Substance Use Topics  . Smoking status: Current Every Day Smoker -- 0.50 packs/day    Types: Cigarettes  . Smokeless tobacco: Not on file  . Alcohol Use: No     Comment: occasion   OB History   Grav Para Term Preterm Abortions TAB SAB Ect Mult Living                 Review of Systems  Constitutional: Negative for fever and chills.  Respiratory:  Negative for shortness of breath.   Cardiovascular: Negative for chest pain.  Musculoskeletal: Positive for arthralgias and myalgias.  All other systems reviewed and are negative.     Allergies  Celecoxib; Motrin; Penicillins; Tramadol; and Naproxen  Home Medications   Prior to Admission medications   Medication Sig Start Date End Date Taking? Authorizing Provider  omeprazole (PRILOSEC) 20 MG capsule Take 20 mg by mouth 2 (two) times daily.   Yes Historical Provider, MD  oxyCODONE-acetaminophen (PERCOCET/ROXICET) 5-325 MG per tablet Take 1-2 tablets by mouth every 6 (six) hours as needed for severe pain. 11/11/13   Mora BellmanHannah S Javoris Star, PA-C  promethazine (PHENERGAN) 25 MG tablet Take 1 tablet (25 mg total) by mouth every 6 (six) hours as needed for nausea or vomiting. 11/11/13   Mora BellmanHannah S Josimar Corning, PA-C   BP 137/99  Pulse 86  Temp(Src) 98.3 F (36.8 C) (Oral)  Resp 16  SpO2 97%  LMP 11/04/2013 Physical Exam  Nursing note and vitals reviewed. Constitutional: She is oriented to person, place, and time. She appears well-developed and well-nourished. She does not appear ill. No distress.  HENT:  Head: Normocephalic and atraumatic.  Right Ear: External ear normal.  Left Ear: External ear normal.  Nose: Nose normal.  Mouth/Throat: Oropharynx is clear and moist.  Eyes: Conjunctivae are normal.  Neck: Normal range of motion.  Cardiovascular: Normal rate, regular rhythm, normal  heart sounds, intact distal pulses and normal pulses.   Pulses:      Radial pulses are 2+ on the right side, and 2+ on the left side.  Pulmonary/Chest: Effort normal and breath sounds normal. No stridor. No respiratory distress. She has no wheezes. She has no rales.  Abdominal: Soft. She exhibits no distension.  Musculoskeletal: Normal range of motion.  Tender to palpation over anterior and lateral right shoulder. ROM limited. Patient guards and cannot lift right arm above head. No tenderness to palpation over chest  or back. No tenderness over right elbow or wrist. Full ROM of both elbow and wrist. No snuff box tenderness Neurovascularly intact. Compartment soft.  Grip strength 5/5 on right.   Neurological: She is alert and oriented to person, place, and time. She has normal strength.  Skin: Skin is warm and dry. She is not diaphoretic. No erythema.  Psychiatric: She has a normal mood and affect. Her behavior is normal.    ED Course  Procedures (including critical care time) Labs Review Labs Reviewed - No data to display  Imaging Review Dg Shoulder Right  11/11/2013   CLINICAL DATA:  Pain in right shoulder for 2 months.  Fell.  EXAM: RIGHT SHOULDER - 2+ VIEW  COMPARISON:  DG ELBOW COMPLETE*R* dated 09/17/2013; DG SHOULDER *R* dated 09/17/2013  FINDINGS: There is a subacute fracture of the greater tuberosity with slight impaction. Calcific tendinitis has developed since priors. Adjacent ribs are intact. Unremarkable AC joint.  IMPRESSION: Subacute fracture of the greater tuberosity with slight impaction. This is difficult to visualize on the initial films by presumably was present. Developing calcific tendinitis.   Electronically Signed   By: Davonna BellingJohn  Curnes M.D.   On: 11/11/2013 10:14     EKG Interpretation None      MDM   Final diagnoses:  Right shoulder pain  Calcific tendonitis   Patient presents to ED for evaluation of persistent right shoulder pain after a fall in March. XR now shows subacute fracture of the greater tuberosity with slight impaction as well as developing calcific tendinitis. I discussed this with the patient and the importance of orthopedic follow up. Discussed the risk of worsening ROM of her right shoulder and permanent disability. Patient has a sling at home which she can use for comfort. She is neurovascularly intact and compartment is soft. Patient will call ortho and try to work out a payment plan. I gave her strengthening and ROM exercises to do at home. Patient was given pain  medications for home as well. Return instructions given. Vital signs stable for discharge. Patient / Family / Caregiver informed of clinical course, understand medical decision-making process, and agree with plan.    Mora BellmanHannah S Rylin Saez, PA-C 11/11/13 [phone]

## 2013-11-11 NOTE — ED Provider Notes (Signed)
Medical screening examination/treatment/procedure(s) were performed by non-physician practitioner and as supervising physician I was immediately available for consultation/collaboration.   EKG Interpretation None       Ethelda ChickMartha K Linker, MD 11/11/13 1147

## 2013-11-21 ENCOUNTER — Encounter (HOSPITAL_COMMUNITY): Payer: Self-pay | Admitting: Emergency Medicine

## 2013-11-21 ENCOUNTER — Emergency Department (HOSPITAL_COMMUNITY)
Admission: EM | Admit: 2013-11-21 | Discharge: 2013-11-21 | Disposition: A | Discharge status: Home / Self Care | Payer: Self-pay | Attending: Emergency Medicine | Admitting: Emergency Medicine

## 2013-11-21 DIAGNOSIS — Y929 Unspecified place or not applicable: Secondary | ICD-10-CM | POA: Insufficient documentation

## 2013-11-21 DIAGNOSIS — W010XXA Fall on same level from slipping, tripping and stumbling without subsequent striking against object, initial encounter: Secondary | ICD-10-CM | POA: Insufficient documentation

## 2013-11-21 DIAGNOSIS — S42251A Displaced fracture of greater tuberosity of right humerus, initial encounter for closed fracture: Secondary | ICD-10-CM

## 2013-11-21 DIAGNOSIS — Z8619 Personal history of other infectious and parasitic diseases: Secondary | ICD-10-CM | POA: Insufficient documentation

## 2013-11-21 DIAGNOSIS — Z880 Allergy status to penicillin: Secondary | ICD-10-CM | POA: Insufficient documentation

## 2013-11-21 DIAGNOSIS — Z79899 Other long term (current) drug therapy: Secondary | ICD-10-CM | POA: Insufficient documentation

## 2013-11-21 DIAGNOSIS — Y9389 Activity, other specified: Secondary | ICD-10-CM | POA: Insufficient documentation

## 2013-11-21 DIAGNOSIS — K219 Gastro-esophageal reflux disease without esophagitis: Secondary | ICD-10-CM | POA: Insufficient documentation

## 2013-11-21 DIAGNOSIS — F17200 Nicotine dependence, unspecified, uncomplicated: Secondary | ICD-10-CM | POA: Insufficient documentation

## 2013-11-21 DIAGNOSIS — S42253A Displaced fracture of greater tuberosity of unspecified humerus, initial encounter for closed fracture: Secondary | ICD-10-CM | POA: Insufficient documentation

## 2013-11-21 MED ORDER — OXYCODONE-ACETAMINOPHEN 5-325 MG PO TABS: 2.0000 | ORAL_TABLET | ORAL | Status: DC | PRN

## 2013-11-21 MED ORDER — OXYCODONE-ACETAMINOPHEN 5-325 MG PO TABS
2.0000 | ORAL_TABLET | Freq: Once | ORAL | Status: AC
  Administered 2013-11-21: 2 via ORAL
  Filled 2013-11-21: qty 2

## 2013-11-21 NOTE — ED Provider Notes (Signed)
CSN: [id number]     Arrival date & time 11/21/13  1219 History   First MD Initiated Contact with Patient 11/21/13 1231     Chief Complaint  Patient presents with  . Fall  . Shoulder Pain     (Consider location/radiation/quality/duration/timing/severity/associated sxs/prior Treatment) HPI Comments: Patient is a 33 year old female who presents with right shoulder pain for the past 2 months that became acutely worse after she tripped and fell last night. The pain worsened suddenly after the fall. She reports landing on her left side and chest from a standing position. The pain is throbbing and severe with radiation down her right arm. She reports having an "arm fracture" for the past 2 months and has been unable to follow up with Orthopedics due to insurance status. Movement and palpation makes the pain worse. No alleviating factors. No other injuries. Patient denies head trauma and LOC.   Patient is a 33 y.o. female presenting with fall and shoulder pain.  Fall Associated symptoms include arthralgias. Pertinent negatives include no abdominal pain, chest pain, chills, fatigue, fever, nausea, neck pain, vomiting or weakness.  Shoulder Pain Associated symptoms include arthralgias. Pertinent negatives include no abdominal pain, chest pain, chills, fatigue, fever, nausea, neck pain, vomiting or weakness.    Past Medical History  Diagnosis Date  . Hepatitis C   . Acid reflux    Past Surgical History  Procedure Laterality Date  . Cholecystectomy     No family history on file. History  Substance Use Topics  . Smoking status: Current Every Day Smoker -- 0.50 packs/day    Types: Cigarettes  . Smokeless tobacco: Not on file  . Alcohol Use: No     Comment: occasion   OB History   Grav Para Term Preterm Abortions TAB SAB Ect Mult Living                 Review of Systems  Constitutional: Negative for fever, chills and fatigue.  HENT: Negative for trouble swallowing.   Eyes: Negative for  visual disturbance.  Respiratory: Negative for shortness of breath.   Cardiovascular: Negative for chest pain and palpitations.  Gastrointestinal: Negative for nausea, vomiting, abdominal pain and diarrhea.  Genitourinary: Negative for dysuria and difficulty urinating.  Musculoskeletal: Positive for arthralgias. Negative for neck pain.  Skin: Negative for color change.  Neurological: Negative for dizziness and weakness.  Psychiatric/Behavioral: Negative for dysphoric mood.      Allergies  Celecoxib; Motrin; Penicillins; Tramadol; and Naproxen  Home Medications   Prior to Admission medications   Medication Sig Start Date End Date Taking? Authorizing Provider  omeprazole (PRILOSEC) 20 MG capsule Take 20 mg by mouth 2 (two) times daily.   Yes Historical Provider, MD  oxyCODONE-acetaminophen (PERCOCET/ROXICET) 5-325 MG per tablet Take 1-2 tablets by mouth every 6 (six) hours as needed for severe pain. 11/11/13  Yes Mora BellmanHannah S Merrell, PA-C  promethazine (PHENERGAN) 25 MG tablet Take 1 tablet (25 mg total) by mouth every 6 (six) hours as needed for nausea or vomiting. 11/11/13  Yes Mora BellmanHannah S Merrell, PA-C   BP 155/109  Pulse 94  Temp(Src) 98 F (36.7 C) (Oral)  Resp 18  SpO2 100%  LMP 11/04/2013 Physical Exam  Nursing note and vitals reviewed. Constitutional: She is oriented to person, place, and time. She appears well-developed and well-nourished. No distress.  HENT:  Head: Normocephalic and atraumatic.  Eyes: Conjunctivae are normal.  Neck: Normal range of motion.  Cardiovascular: Normal rate, regular rhythm and intact  distal pulses.  Exam reveals no gallop and no friction rub.   No murmur heard. Pulmonary/Chest: Effort normal and breath sounds normal. She has no wheezes. She has no rales. She exhibits no tenderness.  Musculoskeletal:  Limited ROM of right shoulder due to pain. No obvious deformity. Generalized tenderness to palpation of the right shoulder. No right elbow involvement.    Neurological: She is alert and oriented to person, place, and time. Coordination normal.  Sensation intact of distal right arm. Speech is goal-oriented. Moves limbs without ataxia.   Skin: Skin is warm and dry.  Psychiatric: She has a normal mood and affect. Her behavior is normal.    ED Course  Procedures (including critical care time) Labs Review Labs Reviewed - No data to display  Imaging Review No results found.   EKG Interpretation None      MDM   Final diagnoses:  Fracture of greater tuberosity of right humerus    1:14 PM I do not think reimaging the right shoulder is required at this time due to the patient not falling on the right shoulder. No neurovascular compromise. Patient will have pain medication here and be discharged with a short course of medication. Patient instructed to follow up with on call Ortho. Patient has an arm sling that she will wear until ortho follow up.     Emilia BeckKaitlyn Navjot Pilgrim, PA-C 11/22/13 2030

## 2013-11-21 NOTE — ED Notes (Signed)
Kaitlyn,PA at bedside 

## 2013-11-21 NOTE — ED Notes (Signed)
Pt states that she injured her rt shoulder in march d/t fall.  States that she tore her "rotary cuff".  States that it has continued to cause her pain and that she is trying to get insurance to go see an orthopedist but "it is taking a long time".  States that she "tripped over a 606 month old baby" last night and injured same arm.

## 2013-11-21 NOTE — Discharge Instructions (Signed)
Take Percocet as needed for pain. Follow up with Dr. Magnus IvanBlackman as soon as possible for further evaluation and management. Refer to attached documents for more information.

## 2013-11-23 NOTE — ED Provider Notes (Signed)
Medical screening examination/treatment/procedure(s) were performed by non-physician practitioner and as supervising physician I was immediately available for consultation/collaboration.   EKG Interpretation None        Daryle Boyington T Bijan Ridgley, MD 11/23/13 0803 

## 2013-12-08 ENCOUNTER — Encounter (HOSPITAL_COMMUNITY): Payer: Self-pay | Admitting: Emergency Medicine

## 2013-12-08 ENCOUNTER — Emergency Department (HOSPITAL_COMMUNITY): Payer: Self-pay

## 2013-12-08 ENCOUNTER — Emergency Department (HOSPITAL_COMMUNITY)
Admission: EM | Admit: 2013-12-08 | Discharge: 2013-12-08 | Disposition: A | Discharge status: Home / Self Care | Payer: Self-pay | Attending: Emergency Medicine | Admitting: Emergency Medicine

## 2013-12-08 DIAGNOSIS — F17200 Nicotine dependence, unspecified, uncomplicated: Secondary | ICD-10-CM | POA: Insufficient documentation

## 2013-12-08 DIAGNOSIS — S20229A Contusion of unspecified back wall of thorax, initial encounter: Secondary | ICD-10-CM | POA: Insufficient documentation

## 2013-12-08 DIAGNOSIS — M549 Dorsalgia, unspecified: Secondary | ICD-10-CM

## 2013-12-08 DIAGNOSIS — Z79899 Other long term (current) drug therapy: Secondary | ICD-10-CM | POA: Insufficient documentation

## 2013-12-08 DIAGNOSIS — Z880 Allergy status to penicillin: Secondary | ICD-10-CM | POA: Insufficient documentation

## 2013-12-08 DIAGNOSIS — Y9289 Other specified places as the place of occurrence of the external cause: Secondary | ICD-10-CM | POA: Insufficient documentation

## 2013-12-08 DIAGNOSIS — Y939 Activity, unspecified: Secondary | ICD-10-CM | POA: Insufficient documentation

## 2013-12-08 DIAGNOSIS — K219 Gastro-esophageal reflux disease without esophagitis: Secondary | ICD-10-CM | POA: Insufficient documentation

## 2013-12-08 DIAGNOSIS — Z8619 Personal history of other infectious and parasitic diseases: Secondary | ICD-10-CM | POA: Insufficient documentation

## 2013-12-08 DIAGNOSIS — S300XXA Contusion of lower back and pelvis, initial encounter: Secondary | ICD-10-CM

## 2013-12-08 DIAGNOSIS — W1809XA Striking against other object with subsequent fall, initial encounter: Secondary | ICD-10-CM | POA: Insufficient documentation

## 2013-12-08 MED ORDER — OXYCODONE-ACETAMINOPHEN 5-325 MG PO TABS
1.0000 | ORAL_TABLET | Freq: Once | ORAL | Status: AC
Start: 2013-12-08 — End: 2013-12-08
  Administered 2013-12-08: 1 via ORAL
  Filled 2013-12-08: qty 1

## 2013-12-08 MED ORDER — HYDROCODONE-ACETAMINOPHEN 5-325 MG PO TABS: 1.0000 | ORAL_TABLET | Freq: Four times a day (QID) | ORAL | Status: DC | PRN

## 2013-12-08 MED ORDER — CYCLOBENZAPRINE HCL 10 MG PO TABS: 10.0000 mg | ORAL_TABLET | Freq: Two times a day (BID) | ORAL | Status: DC | PRN

## 2013-12-08 NOTE — ED Provider Notes (Signed)
Medical screening examination/treatment/procedure(s) were performed by non-physician practitioner and as supervising physician I was immediately available for consultation/collaboration.   EKG Interpretation None       Ethelda Chick, MD 12/08/13 1336

## 2013-12-08 NOTE — ED Provider Notes (Signed)
CSN: [id number]     Arrival date & time 12/08/13  1154 History   First MD Initiated Contact with Patient 12/08/13 1211     Chief Complaint  Patient presents with  . Back Pain   Patient is a 33 y.o. female presenting with back pain. The history is provided by the patient. No language interpreter was used.  Back Pain Associated symptoms: no numbness and no weakness    This chart was scribed for non-physician practitioner Jaynie Crumbleatyana Ginevra Tacker, PA-C working with Ethelda ChickMartha K Linker, MD, by Andrew Auaven Small, ED Scribe. This patient was seen in room WTR8/WTR8 and the patient's care was started at 12:14 PM.  Cheryl Fox is a 33 y.o. female who presents to the Emergency Department complaining of non radiating, worsening,lower back pain onset 1 week. [facility] locked causing her to fall hitting her back on the curb. Pt has not taken any OTC medication due to being allergic to multiple medications. Pt is allergic to naproxen, penicillin  tramadol.  Pt reports trying to use heat and ice for pain but has had no relief. Pt reports she has shaking to right leg since the fall.  Pt denies numbness in feet and loss of bowels. Pt did not drive herself to be seen  Past Medical History  Diagnosis Date  . Hepatitis C   . Acid reflux    Past Surgical History  Procedure Laterality Date  . Cholecystectomy     No family history on file. History  Substance Use Topics  . Smoking status: Current Every Day Smoker -- 0.50 packs/day    Types: Cigarettes  . Smokeless tobacco: Not on file  . Alcohol Use: No     Comment: occasion   OB History   Grav Para Term Preterm Abortions TAB SAB Ect Mult Living                 Review of Systems  Gastrointestinal: Negative for diarrhea, constipation and blood in stool.  Musculoskeletal: Positive for back pain.  Neurological: Negative for weakness and numbness.   Allergies  Celecoxib; Motrin; Penicillins; Tramadol; and Naproxen  Home  Medications   Prior to Admission medications   Medication Sig Start Date End Date Taking? Authorizing Provider  omeprazole (PRILOSEC) 20 MG capsule Take 20 mg by mouth 2 (two) times daily.    Historical Provider, MD  oxyCODONE-acetaminophen (PERCOCET/ROXICET) 5-325 MG per tablet Take 1-2 tablets by mouth every 6 (six) hours as needed for severe pain. 11/11/13   Mora BellmanHannah S Merrell, PA-C  oxyCODONE-acetaminophen (PERCOCET/ROXICET) 5-325 MG per tablet Take 2 tablets by mouth every 4 (four) hours as needed for severe pain. 11/21/13   Kaitlyn Szekalski, PA-C  promethazine (PHENERGAN) 25 MG tablet Take 1 tablet (25 mg total) by mouth every 6 (six) hours as needed for nausea or vomiting. 11/11/13   Mora BellmanHannah S Merrell, PA-C   Triage Vitals- BP 156/95  Pulse 80  Temp(Src) 98.4 F (36.9 C) (Oral)  Resp 20  SpO2 99%  LMP 12/07/2013 Physical Exam  Nursing note and vitals reviewed. Constitutional: She is oriented to person, place, and time. She appears well-developed and well-nourished. No distress.  HENT:  Head: Normocephalic and atraumatic.  Eyes: EOM are normal.  Neck: Neck supple.  Cardiovascular: Normal rate.   Pulmonary/Chest: Effort normal.  Musculoskeletal: Normal range of motion.       Thoracic back: She exhibits no tenderness.  Midline and lumbar tenderness  No bruising no obvious  swelling  Neurological: She is alert and oriented to person, place, and time.  .5/5 and equal lower extremity strength. 2+ and equal patellar reflexes bilaterally. Pt able to dorsiflex bilateral toes and feet with good strength against resistance. Equal sensation bilaterally over thighs and lower legs.   Skin: Skin is warm and dry.  Psychiatric: She has a normal mood and affect. Her behavior is normal.    ED Course  Procedures  Labs Review Labs Reviewed - No data to display  Imaging Review Dg Lumbar Spine Complete  12/08/2013   CLINICAL DATA:  Pain post trauma  EXAM: LUMBAR SPINE - COMPLETE 4+ VIEW   COMPARISON:  None.  FINDINGS: Frontal, lateral, spot lumbosacral lateral, and bilateral oblique views were obtained. The there are 5 non-rib-bearing lumbar type vertebral bodies. There is no fracture or spondylolisthesis. Disk spaces appear intact. There is no appreciable facet arthropathy.  IMPRESSION: No fracture or appreciable arthropathy.   Electronically Signed   By: Bretta Bang M.D.   On: 12/08/2013 13:02     EKG Interpretation None      MDM   Final diagnoses:  Back pain  Lumbar contusion   Pt with back injury after falling and hitting lumbar spine area on a sidewalk. Pt is neurovascularly intact. No signs of cauda equina. Ambulatory. X-ray negative. Home with norco, flexeril, pt allergic to NSAIDs, pcp follow up.   Filed Vitals:   12/08/13 1208  BP: 156/95  Pulse: 80  Temp: 98.4 F (36.9 C)  TempSrc: Oral  Resp: 20  SpO2: 99%      I personally performed the services described in this documentation, which was scribed in my presence. The recorded information has been reviewed and is accurate.    Lottie Mussel, PA-C 12/08/13 1335

## 2013-12-08 NOTE — Discharge Instructions (Signed)
Take norco as prescribed as needed for severe pain. Flexeril for muscle spasms. Heat/ice. Follow up with primary care doctor.    Back Pain, Adult Low back pain is very common. About 1 in 5 people have back pain.The cause of low back pain is rarely dangerous. The pain often gets better over time.About half of people with a sudden onset of back pain feel better in just 2 weeks. About 8 in 10 people feel better by 6 weeks.  CAUSES Some common causes of back pain include:  Strain of the muscles or ligaments supporting the spine.  Wear and tear (degeneration) of the spinal discs.  Arthritis.  Direct injury to the back. DIAGNOSIS Most of the time, the direct cause of low back pain is not known.However, back pain can be treated effectively even when the exact cause of the pain is unknown.Answering your caregiver's questions about your overall health and symptoms is one of the most accurate ways to make sure the cause of your pain is not dangerous. If your caregiver needs more information, he or she may order lab work or imaging tests (X-rays or MRIs).However, even if imaging tests show changes in your back, this usually does not require surgery. HOME CARE INSTRUCTIONS For many people, back pain returns.Since low back pain is rarely dangerous, it is often a condition that people can learn to [facility] their own.   Remain active. It is stressful on the back to sit or stand in one place. Do not sit, drive, or stand in one place for more than 30 minutes at a time. Take short walks on level surfaces as soon as pain allows.Try to increase the length of time you walk each day.  Do not stay in bed.Resting more than 1 or 2 days can delay your recovery.  Do not avoid exercise or work.Your body is made to move.It is not dangerous to be active, even though your back may hurt.Your back will likely heal faster if you return to being active before your pain is gone.  Pay attention to your body when  you bend and lift. Many people have less discomfortwhen lifting if they bend their knees, keep the load close to their bodies,and avoid twisting. Often, the most comfortable positions are those that put less stress on your recovering back.  Find a comfortable position to sleep. Use a firm mattress and lie on your side with your knees slightly bent. If you lie on your back, put a pillow under your knees.  Only take over-the-counter or prescription medicines as directed by your caregiver. Over-the-counter medicines to reduce pain and inflammation are often the most helpful.Your caregiver may prescribe muscle relaxant drugs.These medicines help dull your pain so you can more quickly return to your normal activities and healthy exercise.  Put ice on the injured area.  Put ice in a plastic bag.  Place a towel between your skin and the bag.  Leave the ice on for 15-20 minutes, 03-04 times a day for the first 2 to 3 days. After that, ice and heat may be alternated to reduce pain and spasms.  Ask your caregiver about trying back exercises and gentle massage. This may be of some benefit.  Avoid feeling anxious or stressed.Stress increases muscle tension and can worsen back pain.It is important to recognize when you are anxious or stressed and learn ways to manage it.Exercise is a great option. SEEK MEDICAL CARE IF:  You have pain that is not relieved with rest or medicine.  You have pain that does not improve in 1 week.  You have new symptoms.  You are generally not feeling well. SEEK IMMEDIATE MEDICAL CARE IF:   You have pain that radiates from your back into your legs.  You develop new bowel or bladder control problems.  You have unusual weakness or numbness in your arms or legs.  You develop nausea or vomiting.  You develop abdominal pain.  You feel faint. Document Released: 06/26/2005 Document Revised: 12/26/2011 Document Reviewed: 11/14/2010 [facility] Patient Information  2014 [employer] [place].

## 2013-12-08 NOTE — ED Notes (Signed)
Pt reports that last Monday she fell while [place] [employer] after stepping off a curb. Pt reports that her lower back hit the curb when she fell. Pt reports that at home she has tried resting her back as well as heat and ice. Pt reports difficulty sleeping and worsening pain with ambulation. Pt ambulatory to triage room, vitals are WDL, and pt is in NAD.

## 2013-12-15 ENCOUNTER — Ambulatory Visit: Payer: Self-pay | Attending: Orthopaedic Surgery | Admitting: Physical Therapy

## 2014-04-24 ENCOUNTER — Emergency Department (HOSPITAL_COMMUNITY): Payer: Self-pay

## 2014-04-24 ENCOUNTER — Encounter (HOSPITAL_COMMUNITY): Payer: Self-pay | Admitting: Emergency Medicine

## 2014-04-24 ENCOUNTER — Emergency Department (HOSPITAL_COMMUNITY)
Admission: EM | Admit: 2014-04-24 | Discharge: 2014-04-24 | Disposition: A | Discharge status: Home / Self Care | Payer: Self-pay | Attending: Emergency Medicine | Admitting: Emergency Medicine

## 2014-04-24 DIAGNOSIS — R0789 Other chest pain: Secondary | ICD-10-CM | POA: Insufficient documentation

## 2014-04-24 DIAGNOSIS — K219 Gastro-esophageal reflux disease without esophagitis: Secondary | ICD-10-CM | POA: Insufficient documentation

## 2014-04-24 DIAGNOSIS — Z3202 Encounter for pregnancy test, result negative: Secondary | ICD-10-CM | POA: Insufficient documentation

## 2014-04-24 DIAGNOSIS — Z720 Tobacco use: Secondary | ICD-10-CM | POA: Insufficient documentation

## 2014-04-24 DIAGNOSIS — R12 Heartburn: Secondary | ICD-10-CM | POA: Insufficient documentation

## 2014-04-24 DIAGNOSIS — Z9049 Acquired absence of other specified parts of digestive tract: Secondary | ICD-10-CM | POA: Insufficient documentation

## 2014-04-24 DIAGNOSIS — K5900 Constipation, unspecified: Secondary | ICD-10-CM | POA: Insufficient documentation

## 2014-04-24 DIAGNOSIS — Z880 Allergy status to penicillin: Secondary | ICD-10-CM | POA: Insufficient documentation

## 2014-04-24 DIAGNOSIS — Z79899 Other long term (current) drug therapy: Secondary | ICD-10-CM | POA: Insufficient documentation

## 2014-04-24 DIAGNOSIS — R112 Nausea with vomiting, unspecified: Secondary | ICD-10-CM | POA: Insufficient documentation

## 2014-04-24 DIAGNOSIS — Z8619 Personal history of other infectious and parasitic diseases: Secondary | ICD-10-CM | POA: Insufficient documentation

## 2014-04-24 DIAGNOSIS — R079 Chest pain, unspecified: Secondary | ICD-10-CM

## 2014-04-24 LAB — CBC
HCT: 37.3 % (ref 36.0–46.0)
Hemoglobin: 12.3 g/dL (ref 12.0–15.0)
MCH: 26.4 pg (ref 26.0–34.0)
MCHC: 33 g/dL (ref 30.0–36.0)
MCV: 80 fL (ref 78.0–100.0)
Platelets: 274 10*3/uL (ref 150–400)
RBC: 4.66 MIL/uL (ref 3.87–5.11)
RDW: 14.1 % (ref 11.5–15.5)
WBC: 9.5 10*3/uL (ref 4.0–10.5)

## 2014-04-24 LAB — POC URINE PREG, ED: Preg Test, Ur: NEGATIVE

## 2014-04-24 LAB — I-STAT TROPONIN, ED: TROPONIN I, POC: 0 ng/mL (ref 0.00–0.08)

## 2014-04-24 LAB — BASIC METABOLIC PANEL
Anion gap: 11 (ref 5–15)
BUN: 11 mg/dL (ref 6–23)
CHLORIDE: 102 meq/L (ref 96–112)
CO2: 26 mEq/L (ref 19–32)
Calcium: 9.2 mg/dL (ref 8.4–10.5)
Creatinine, Ser: 0.83 mg/dL (ref 0.50–1.10)
GFR calc non Af Amer: >90 mL/min (ref >90)
Glucose, Bld: 117 mg/dL — ABNORMAL HIGH (ref 70–99)
POTASSIUM: 4.3 meq/L (ref 3.7–5.3)
Sodium: 139 mEq/L (ref 137–147)

## 2014-04-24 LAB — PRO B NATRIURETIC PEPTIDE: PRO B NATRI PEPTIDE: 249.5 pg/mL — AB (ref 0–125)

## 2014-04-24 MED ORDER — OMEPRAZOLE 20 MG PO CPDR: 20.0000 mg | DELAYED_RELEASE_CAPSULE | Freq: Every day | ORAL | Status: DC

## 2014-04-24 MED ORDER — ACETAMINOPHEN 160 MG/5ML PO ELIX: 500.0000 mg | ORAL_SOLUTION | Freq: Four times a day (QID) | ORAL | Status: DC | PRN

## 2014-04-24 NOTE — ED Provider Notes (Signed)
CSN: [id number]     Arrival date & time 04/24/14  1332 History   First MD Initiated Contact with Patient 04/24/14 1509     Chief Complaint  Patient presents with  . Chest Pain  . Emesis  . Shortness of Breath     (Consider location/radiation/quality/duration/timing/severity/associated sxs/prior Treatment) HPI Comments: Cheryl Fox is a 33 y.o. female with a PMHx of GERD and Hep C, who presents to the ED with complaints of 3 days of R sided CP. Pt states the pain is 8/10 constant, sharp, nonradiating, worse with eating ("feels like food get's stuck") breathing and coughing (chronic cough with clear sputum, unchanged), unchanged with excedrin, and without alleviating factors. Associated symptoms include nausea/vomiting due to the feeling of food getting stuck, but able to tolerate tea and hash browns prior to arrival, and emesis is undigested foods. Also endorses some mild associated epigastric pain related to her chronic GERD, uses zantac without relief. Endorses some mild constipation but states she's had flatus. Denies fevers, chills, DOE, SOB, chest tightness or pressure, radiation to left arm, jaw or back, or diaphoresis, LE swelling, hx of DVT, estrogen use, or recent travel/surgery. Denies urinary changes or melena/hematochezia. Denies hematemesis. Has had similar symptoms as this in the past but has never seen a primary care doctor or GI doctor for these symptoms.    Patient is a 33 y.o. female presenting with chest pain. The history is provided by the patient. No language interpreter was used.  Chest Pain Pain location:  Epigastric and R chest Pain quality: sharp   Pain radiates to:  Does not radiate Pain radiates to the back: no   Pain severity:  Moderate (8/10) Onset quality:  Gradual Duration:  3 days Timing:  Constant Progression:  Unchanged Chronicity:  Recurrent Context: breathing, eating and at rest   Relieved by:  Nothing Worsened by:  Coughing and deep breathing (and  eating) Ineffective treatments: excedrin. Associated symptoms: abdominal pain (epigastric), cough (chronic, productive of clear sputum), dysphagia ("feels like food gets stuck"), heartburn (chronic), nausea and vomiting   Associated symptoms: no back pain, no diaphoresis, no dizziness, no fever, no headache, no lower extremity edema, no near-syncope, no numbness, no orthopnea, no palpitations, no PND, no shortness of breath, no syncope and no weakness   Risk factors: obesity and smoking   Risk factors: no birth control, no diabetes mellitus and no prior DVT/PE     Past Medical History  Diagnosis Date  . Hepatitis C   . Acid reflux    Past Surgical History  Procedure Laterality Date  . Cholecystectomy     No family history on file. History  Substance Use Topics  . Smoking status: Current Every Day Smoker -- 0.50 packs/day    Types: Cigarettes  . Smokeless tobacco: Not on file  . Alcohol Use: No     Comment: occasion   OB History   Grav Para Term Preterm Abortions TAB SAB Ect Mult Living                 Review of Systems  Constitutional: Negative for fever and diaphoresis.  HENT: Positive for trouble swallowing ("feels like food gets stuck").   Respiratory: Positive for cough (chronic, productive of clear sputum). Negative for choking, chest tightness, shortness of breath and wheezing.   Cardiovascular: Positive for chest pain. Negative for palpitations, orthopnea, leg swelling, syncope, PND and near-syncope.  Gastrointestinal: Positive for heartburn (chronic), nausea, vomiting, abdominal pain (epigastric) and constipation. Negative for diarrhea,  blood in stool and abdominal distention.  Genitourinary: Negative for dysuria, hematuria, decreased urine volume and vaginal discharge.  Musculoskeletal: Negative for arthralgias, back pain and myalgias.  Skin: Negative for color change.  Neurological: Negative for dizziness, syncope, weakness, numbness and headaches.    10 Systems  reviewed and are negative for acute change except as noted in the HPI.   Allergies  Celecoxib; Motrin; Penicillins; Tramadol; and Naproxen  Home Medications   Prior to Admission medications   Medication Sig Start Date End Date Taking? Authorizing Provider  aspirin-acetaminophen-caffeine (EXCEDRIN MIGRAINE) [id number] MG per tablet Take 1 tablet by mouth every 8 (eight) hours as needed for migraine.   Yes Historical Provider, MD  omeprazole (PRILOSEC) 20 MG capsule Take 20 mg by mouth 2 (two) times daily.   Yes Historical Provider, MD   BP 158/80  Pulse 78  Temp(Src) 98.4 F (36.9 C) (Oral)  Resp 19  SpO2 98%  LMP 04/17/2014 Physical Exam  Nursing note and vitals reviewed. Constitutional: She is oriented to person, place, and time. Vital signs are normal. She appears well-developed and well-nourished. No distress.  Afebrile, nontoxic, NAD, resting comfortably in bed  HENT:  Head: Normocephalic and atraumatic.  Mouth/Throat: Oropharynx is clear and moist and mucous membranes are normal.  Eyes: Conjunctivae and EOM are normal. Right eye exhibits no discharge. Left eye exhibits no discharge.  Neck: Normal range of motion. Neck supple. No JVD present.  Cardiovascular: Normal rate, regular rhythm, normal heart sounds and intact distal pulses.  Exam reveals no gallop and no friction rub.   No murmur heard. RRR, nl s1/s2, no m/r/g  Pulmonary/Chest: Effort normal and breath sounds normal. No respiratory distress. She has no decreased breath sounds. She has no wheezes. She has no rhonchi. She has no rales. She exhibits tenderness. She exhibits no crepitus, no deformity and no retraction.    CTAB in all lung fields Mild right-sided chest wall tenderness without crepitus or deformity, no subQ air or retraction  Abdominal: Soft. Normal appearance and bowel sounds are normal. She exhibits no distension. There is no tenderness. There is no rigidity, no rebound, no guarding, no CVA tenderness  and no tenderness at McBurney's point.  Soft, NTND, +BS throughout, no r/g/r  Musculoskeletal: Normal range of motion.  Neurological: She is alert and oriented to person, place, and time. She has normal strength. No sensory deficit.  Skin: Skin is warm, dry and intact. No rash noted.  Psychiatric: She has a normal mood and affect.    ED Course  Procedures (including critical care time) Labs Review Labs Reviewed  BASIC METABOLIC PANEL - Abnormal; Notable for the following:    Glucose, Bld 117 (*)    All other components within normal limits  PRO B NATRIURETIC PEPTIDE - Abnormal; Notable for the following:    Pro B Natriuretic peptide (BNP) 249.5 (*)    All other components within normal limits  CBC  I-STAT TROPOININ, ED  POC URINE PREG, ED    Imaging Review Dg Chest 2 View  04/24/2014   CLINICAL DATA:  Cough and short of breath.  Chest pain  EXAM: CHEST  2 VIEW  COMPARISON:  08/05/2013  FINDINGS: The heart size and mediastinal contours are within normal limits. Both lungs are clear. The visualized skeletal structures are unremarkable.  IMPRESSION: No active cardiopulmonary disease.   Electronically Signed   By: Marlan Palauharles  Clark M.D.   On: 04/24/2014 14:27     EKG Interpretation None    EKG:  sinus rhythm with multiple PVCs, unremarkable  MDM   Final diagnoses:  Chest wall pain  Gastroesophageal reflux disease, esophagitis presence not specified  Non-intractable vomiting with nausea, vomiting of unspecified type    33y/o female with chest wall pain, reproducible on exam, PERC negative therefore doubt need for dimer or CTA. EKG unremarkable, CXR WNL, and all labs WNL aside from mildly elevated glucose and BNP but pt without CHF symptoms or diagnosis. I believe this is musculoskeletal in etiology, and some component of dysphagia likely from chronic GERD. Will refer to GI for ongoing management of symptoms, but discussed soft/liquid diet since she's able to tolerate some liquids and  soft foods here today. Could be diffuse esophageal spasm. Will rx prilosec since zantac isn't working and would like to see if this would improve GERD symptoms. Will have pt use heat for MsK pain, since NSAIDs would aggravate her GERD and doubt need for narcotics. Could use some tylenol for additional relief. I explained the diagnosis and have given explicit precautions to return to the ER including for any other new or worsening symptoms. The patient understands and accepts the medical plan as it's been dictated and I have answered their questions. Discharge instructions concerning home care and prescriptions have been given. The patient is STABLE and is discharged to home in good condition.  BP 158/80  Pulse 78  Temp(Src) 98.4 F (36.9 C) (Oral)  Resp 19  SpO2 98%  LMP 04/17/2014  Meds ordered this encounter  Medications  . acetaminophen (TYLENOL) 160 MG/5ML elixir    Sig: Take 15.6 mLs (500 mg total) by mouth every 6 (six) hours as needed for fever or pain.    Dispense:  240 mL    Refill:  0    Order Specific Question:  Supervising Provider    Answer:  Eber HongMILLER, BRIAN D [[id number]]  . omeprazole (PRILOSEC) 20 MG capsule    Sig: Take 1 capsule (20 mg total) by mouth daily.    Dispense:  30 capsule    Refill:  0    Order Specific Question:  Supervising Provider    Answer:  Vida RollerMILLER, BRIAN D [address][id number]]       Florestine Carmical Strupp Camprubi-Soms, PA-C 04/24/14 1605

## 2014-04-24 NOTE — Discharge Instructions (Signed)
Use heat over the areas of pain, and use tylenol as directed as needed for additional relief. Avoid NSAIDs like ibuprofen or excedrin as this will make your reflux worse. Start taking prilosec as directed for your reflux, as this might be the source of your discomfort. Drink fluids and eat a soft diet to help with the sensation that food is not going down well. Eat small amounts and chew well. See the gastroenterologist listed above for ongoing management. Return to the ER for changes or worsening symptoms.   Chest Wall Pain Chest wall pain is pain felt in or around the chest bones and muscles. It may take up to 6 weeks to get better. It may take longer if you are active. Chest wall pain can happen on its own. Other times, things like germs, injury, coughing, or exercise can cause the pain. HOME CARE   Avoid activities that make you tired or cause pain. Try not to use your chest, belly (abdominal), or side muscles. Do not use heavy weights.  Put ice on the sore area.  Put ice in a plastic bag.  Place a towel between your skin and the bag.  Leave the ice on for 15-20 minutes for the first 2 days.  Only take medicine as told by your doctor. GET HELP RIGHT AWAY IF:   You have more pain or are very uncomfortable.  You have a fever.  Your chest pain gets worse.  You have new problems.  You feel sick to your stomach (nauseous) or throw up (vomit).  You start to sweat or feel lightheaded.  You have a cough with mucus (phlegm).  You cough up blood. MAKE SURE YOU:   Understand these instructions.  Will watch your condition.  Will get help right away if you are not doing well or get worse. Document Released: 12/13/2007 Document Revised: 09/18/2011 Document Reviewed: 02/20/2011 [facility] Patient Information 2015 [place], [place]. This information is not intended to replace advice given to you by your health care provider. Make sure you discuss any questions you have with your health  care provider.  Esophagitis Esophagitis is inflammation of the esophagus. It can involve swelling, soreness, and pain in the esophagus. This condition can make it difficult and painful to swallow. CAUSES  Most causes of esophagitis are not serious. Many different factors can cause esophagitis, including:  Gastroesophageal reflux disease (GERD). This is when acid from your stomach flows up into the esophagus.  Recurrent vomiting.  An allergic-type reaction.  Certain medicines, especially those that come in large pills.  Ingestion of harmful chemicals, such as household cleaning products.  Heavy alcohol use.  An infection of the esophagus.  Radiation treatment for cancer.  Certain diseases such as sarcoidosis, Crohn's disease, and scleroderma. These diseases may cause recurrent esophagitis. SYMPTOMS   Trouble swallowing.  Painful swallowing.  Chest pain.  Difficulty breathing.  Nausea.  Vomiting.  Abdominal pain. DIAGNOSIS  Your caregiver will take your history and do a physical exam. Depending upon what your caregiver finds, certain tests may also be done, including:  Barium X-ray. You will drink a solution that coats the esophagus, and X-rays will be taken.  Endoscopy. A lighted tube is put down the esophagus so your caregiver can examine the area.  Allergy tests. These can sometimes be arranged through follow-up visits. TREATMENT  Treatment will depend on the cause of your esophagitis. In some cases, steroids or other medicines may be given to help relieve your symptoms or to treat the  underlying cause of your condition. Medicines that may be recommended include:  Viscous lidocaine, to soothe the esophagus.  Antacids.  Acid reducers.  Proton pump inhibitors.  Antiviral medicines for certain viral infections of the esophagus.  Antifungal medicines for certain fungal infections of the esophagus.  Antibiotic medicines, depending on the cause of the  esophagitis. HOME CARE INSTRUCTIONS   Avoid foods and drinks that seem to make your symptoms worse.  Eat small, frequent meals instead of large meals.  Avoid eating for the 3 hours prior to your bedtime.  If you have trouble taking pills, use a pill splitter to decrease the size and likelihood of the pill getting stuck or injuring the esophagus on the way down. Drinking water after taking a pill also helps.  Stop smoking if you smoke.  Maintain a healthy weight.  Wear loose-fitting clothing. Do not wear anything tight around your waist that causes pressure on your stomach.  Raise the head of your bed 6 to 8 inches with wood blocks to help you sleep. Extra pillows will not help.  Only take over-the-counter or prescription medicines as directed by your caregiver. SEEK IMMEDIATE MEDICAL CARE IF:  You have severe chest pain that radiates into your arm, neck, or jaw.  You feel sweaty, dizzy, or lightheaded.  You have shortness of breath.  You vomit blood.  You have difficulty or pain with swallowing.  You have bloody or black, tarry stools.  You have a fever.  You have a burning sensation in the chest more than 3 times a week for more than 2 weeks.  You cannot swallow, drink, or eat.  You drool because you cannot swallow your saliva. MAKE SURE YOU:  Understand these instructions.  Will watch your condition.  Will get help right away if you are not doing well or get worse. Document Released: 08/03/2004 Document Revised: 09/18/2011 Document Reviewed: 02/24/2011 [facility] Patient Information 2015 [place], [place]. This information is not intended to replace advice given to you by your health care provider. Make sure you discuss any questions you have with your health care provider.  Gastroesophageal Reflux Disease, Adult Gastroesophageal reflux disease (GERD) happens when acid from your stomach goes into your food pipe (esophagus). The acid can cause a burning feeling in  your chest. Over time, the acid can make small holes (ulcers) in your food pipe.  HOME CARE  Ask your doctor for advice about:  Losing weight.  Quitting smoking.  Alcohol use.  Avoid foods and drinks that make your problems worse. You may want to avoid:  Caffeine and alcohol.  Chocolate.  Mints.  Garlic and onions.  Spicy foods.  Citrus fruits, such as oranges, lemons, or limes.  Foods that contain tomato, such as sauce, chili, salsa, and pizza.  Fried and fatty foods.  Avoid lying down for 3 hours before you go to bed or before you take a nap.  Eat small meals often, instead of large meals.  Wear loose-fitting clothing. Do not wear anything tight around your waist.  Raise (elevate) the head of your bed 6 to 8 inches with wood blocks. Using extra pillows does not help.  Only take medicines as told by your doctor.  Do not take aspirin or ibuprofen. GET HELP RIGHT AWAY IF:   You have pain in your arms, neck, jaw, teeth, or back.  Your pain gets worse or changes.  You feel sick to your stomach (nauseous), throw up (vomit), or sweat (diaphoresis).  You feel short of  breath, or you pass out (faint).  Your throw up is green, yellow, black, or looks like coffee grounds or blood.  Your poop (stool) is red, bloody, or black. MAKE SURE YOU:   Understand these instructions.  Will watch your condition.  Will get help right away if you are not doing well or get worse. Document Released: 12/13/2007 Document Revised: 09/18/2011 Document Reviewed: 01/13/2011 [facility] Patient Information 2015 [place], [place]. This information is not intended to replace advice given to you by your health care provider. Make sure you discuss any questions you have with your health care provider.  Food Choices for Gastroesophageal Reflux Disease When you have gastroesophageal reflux disease (GERD), the foods you eat and your eating habits are very important. Choosing the right foods can  help ease the discomfort of GERD. WHAT GENERAL GUIDELINES DO I NEED TO FOLLOW?  Choose fruits, vegetables, whole grains, low-fat dairy products, and low-fat meat, fish, and poultry.  Limit fats such as oils, salad dressings, butter, nuts, and avocado.  Keep a food diary to identify foods that cause symptoms.  Avoid foods that cause reflux. These may be different for different people.  Eat frequent small meals instead of three large meals each day.  Eat your meals slowly, in a relaxed setting.  Limit fried foods.  Cook foods using methods other than frying.  Avoid drinking alcohol.  Avoid drinking large amounts of liquids with your meals.  Avoid bending over or lying down until 2-3 hours after eating. WHAT FOODS ARE NOT RECOMMENDED? The following are some foods and drinks that may worsen your symptoms: Vegetables Tomatoes. Tomato juice. Tomato and spaghetti sauce. Chili peppers. Onion and garlic. Horseradish. Fruits Oranges, grapefruit, and lemon (fruit and juice). Meats High-fat meats, fish, and poultry. This includes hot dogs, ribs, ham, sausage, salami, and bacon. Dairy Whole milk and chocolate milk. Sour cream. Cream. Butter. Ice cream. Cream cheese.  Beverages Coffee and tea, with or without caffeine. Carbonated beverages or energy drinks. Condiments Hot sauce. Barbecue sauce.  Sweets/Desserts Chocolate and cocoa. Donuts. Peppermint and spearmint. Fats and Oils High-fat foods, including [place] fries and potato chips. Other Vinegar. Strong spices, such as black pepper, white pepper, red pepper, cayenne, curry powder, cloves, ginger, and chili powder. The items listed above may not be a complete list of foods and beverages to avoid. Contact your dietitian for more information. Document Released: 06/26/2005 Document Revised: 07/01/2013 Document Reviewed: 04/30/2013 [facility] Patient Information 2015 [place], [place]. This information is not intended to replace advice  given to you by your health care provider. Make sure you discuss any questions you have with your health care provider.  Nausea and Vomiting Nausea means you feel sick to your stomach. Throwing up (vomiting) is a reflex where stomach contents come out of your mouth. HOME CARE   Take medicine as told by your doctor.  Do not force yourself to eat. However, you do need to drink fluids.  If you feel like eating, eat a normal diet as told by your doctor.  Eat rice, wheat, potatoes, bread, lean meats, yogurt, fruits, and vegetables.  Avoid high-fat foods.  Drink enough fluids to keep your pee (urine) clear or pale yellow.  Ask your doctor how to replace body fluid losses (rehydrate). Signs of body fluid loss (dehydration) include:  Feeling very thirsty.  Dry lips and mouth.  Feeling dizzy.  Dark pee.  Peeing less than normal.  Feeling confused.  Fast breathing or heart rate. GET HELP RIGHT AWAY IF:  You have blood in your throw up.  You have black or bloody poop (stool).  You have a bad headache or stiff neck.  You feel confused.  You have bad belly (abdominal) pain.  You have chest pain or trouble breathing.  You do not pee at least once every 8 hours.  You have cold, clammy skin.  You keep throwing up after 24 to 48 hours.  You have a fever. MAKE SURE YOU:   Understand these instructions.  Will watch your condition.  Will get help right away if you are not doing well or get worse. Document Released: 12/13/2007 Document Revised: 09/18/2011 Document Reviewed: 11/25/2010 [facility] Patient Information 2015 [place], [place]. This information is not intended to replace advice given to you by your health care provider. Make sure you discuss any questions you have with your health care provider.

## 2014-04-24 NOTE — ED Notes (Signed)
Pt states that three days ago started having right sided chest pain with shob, sweating and vomiting. Pt states that went away for about half a day then has been back constant. Pt states that she does smoke.

## 2014-04-24 NOTE — ED Provider Notes (Signed)
Medical screening examination/treatment/procedure(s) were performed by non-physician practitioner and as supervising physician I was immediately available for consultation/collaboration.  Flint MelterElliott L Anniebell Bedore, MD 04/24/14 [phone]

## 2014-06-15 ENCOUNTER — Emergency Department (HOSPITAL_COMMUNITY)
Admission: EM | Admit: 2014-06-15 | Discharge: 2014-06-15 | Disposition: A | Discharge status: Home / Self Care | Payer: Self-pay | Attending: Emergency Medicine | Admitting: Emergency Medicine

## 2014-06-15 ENCOUNTER — Emergency Department (HOSPITAL_COMMUNITY): Payer: Self-pay

## 2014-06-15 ENCOUNTER — Encounter (HOSPITAL_COMMUNITY): Payer: Self-pay | Admitting: Emergency Medicine

## 2014-06-15 DIAGNOSIS — K219 Gastro-esophageal reflux disease without esophagitis: Secondary | ICD-10-CM | POA: Insufficient documentation

## 2014-06-15 DIAGNOSIS — Z720 Tobacco use: Secondary | ICD-10-CM | POA: Insufficient documentation

## 2014-06-15 DIAGNOSIS — Z8619 Personal history of other infectious and parasitic diseases: Secondary | ICD-10-CM | POA: Insufficient documentation

## 2014-06-15 DIAGNOSIS — M25511 Pain in right shoulder: Secondary | ICD-10-CM | POA: Insufficient documentation

## 2014-06-15 DIAGNOSIS — M5442 Lumbago with sciatica, left side: Secondary | ICD-10-CM | POA: Insufficient documentation

## 2014-06-15 DIAGNOSIS — Z79899 Other long term (current) drug therapy: Secondary | ICD-10-CM | POA: Insufficient documentation

## 2014-06-15 DIAGNOSIS — M546 Pain in thoracic spine: Secondary | ICD-10-CM | POA: Insufficient documentation

## 2014-06-15 DIAGNOSIS — Z880 Allergy status to penicillin: Secondary | ICD-10-CM | POA: Insufficient documentation

## 2014-06-15 DIAGNOSIS — Z8781 Personal history of (healed) traumatic fracture: Secondary | ICD-10-CM | POA: Insufficient documentation

## 2014-06-15 MED ORDER — CYCLOBENZAPRINE HCL 10 MG PO TABS: 10.0000 mg | ORAL_TABLET | Freq: Two times a day (BID) | ORAL | Status: DC | PRN

## 2014-06-15 MED ORDER — HYDROCODONE-ACETAMINOPHEN 5-325 MG PO TABS
1.0000 | ORAL_TABLET | Freq: Once | ORAL | Status: AC
  Administered 2014-06-15: 1 via ORAL
  Filled 2014-06-15: qty 1

## 2014-06-15 MED ORDER — HYDROCODONE-ACETAMINOPHEN 5-325 MG PO TABS: 1.0000 | ORAL_TABLET | Freq: Four times a day (QID) | ORAL | Status: DC | PRN

## 2014-06-15 MED ORDER — PREDNISONE 10 MG PO TABS: ORAL_TABLET | ORAL | Status: DC

## 2014-06-15 NOTE — ED Notes (Addendum)
Pt has multiple complaints. Pt c/o R shoulder pain after R humerus fracture at the beginning of 2015. Pt sts she also had a torn rotator cuff but didn't have insurance to go to physical therapy. Pt sts that she can no longer move her arm without extreme pain. Pt also c/o middle back pain x 3 weeks. Denies injury. Sts the pain also occurs in lower back and shoots down L leg.

## 2014-06-15 NOTE — ED Notes (Signed)
Pt states past week has had lower back pain, left posterior leg aching and numbness, right arm pain and decreased range of motion.

## 2014-06-15 NOTE — Discharge Instructions (Signed)
Impingement Syndrome, Rotator Cuff, Bursitis with Rehab Impingement syndrome is a condition that involves inflammation of the tendons of the rotator cuff and the subacromial bursa, that causes pain in the shoulder. The rotator cuff consists of four tendons and muscles that control much of the shoulder and upper arm function. The subacromial bursa is a fluid filled sac that helps reduce friction between the rotator cuff and one of the bones of the shoulder (acromion). Impingement syndrome is usually an overuse injury that causes swelling of the bursa (bursitis), swelling of the tendon (tendonitis), and/or a tear of the tendon (strain). Strains are classified into three categories. Grade 1 strains cause pain, but the tendon is not lengthened. Grade 2 strains include a lengthened ligament, due to the ligament being stretched or partially ruptured. With grade 2 strains there is still function, although the function may be decreased. Grade 3 strains include a complete tear of the tendon or muscle, and function is usually impaired. SYMPTOMS   Pain around the shoulder, often at the outer portion of the upper arm.  Pain that gets worse with shoulder function, especially when reaching overhead or lifting.  Sometimes, aching when not using the arm.  Pain that wakes you up at night.  Sometimes, tenderness, swelling, warmth, or redness over the affected area.  Loss of strength.  Limited motion of the shoulder, especially reaching behind the back (to the back pocket or to unhook bra) or across your body.  Crackling sound (crepitation) when moving the arm.  Biceps tendon pain and inflammation (in the front of the shoulder). Worse when bending the elbow or lifting. CAUSES  Impingement syndrome is often an overuse injury, in which chronic (repetitive) motions cause the tendons or bursa to become inflamed. A strain occurs when a force is paced on the tendon or muscle that is greater than it can withstand.  Common mechanisms of injury include: Stress from sudden increase in duration, frequency, or intensity of training.  Direct hit (trauma) to the shoulder.  Aging, erosion of the tendon with normal use.  Bony bump on shoulder (acromial spur). RISK INCREASES WITH:  Contact sports (football, wrestling, boxing).  Throwing sports (baseball, tennis, volleyball).  Weightlifting and bodybuilding.  Heavy labor.  Previous injury to the rotator cuff, including impingement.  Poor shoulder strength and flexibility.  Failure to warm up properly before activity.  Inadequate protective equipment.  Old age.  Bony bump on shoulder (acromial spur). PREVENTION   Warm up and stretch properly before activity.  Allow for adequate recovery between workouts.  Maintain physical fitness:  Strength, flexibility, and endurance.  Cardiovascular fitness.  Learn and use proper exercise technique. PROGNOSIS  If treated properly, impingement syndrome usually goes away within 6 weeks. Sometimes surgery is required.  RELATED COMPLICATIONS   Longer healing time if not properly treated, or if not given enough time to heal.  Recurring symptoms, that result in a chronic condition.  Shoulder stiffness, frozen shoulder, or loss of motion.  Rotator cuff tendon tear.  Recurring symptoms, especially if activity is resumed too soon, with overuse, with a direct blow, or when using poor technique. TREATMENT  Treatment first involves the use of ice and medicine, to reduce pain and inflammation. The use of strengthening and stretching exercises may help reduce pain with activity. These exercises may be performed at home or with a therapist. If non-surgical treatment is unsuccessful after more than 6 months, surgery may be advised. After surgery and rehabilitation, activity is usually possible in 3 months.  MEDICATION  If pain medicine is needed, nonsteroidal anti-inflammatory medicines (aspirin and  ibuprofen), or other minor pain relievers (acetaminophen), are often advised.  Do not take pain medicine for 7 days before surgery.  Prescription pain relievers may be given, if your caregiver thinks they are needed. Use only as directed and only as much as you need.  Corticosteroid injections may be given by your caregiver. These injections should be reserved for the most serious cases, because they may only be given a certain number of times. HEAT AND COLD  Cold treatment (icing) should be applied for 10 to 15 minutes every 2 to 3 hours for inflammation and pain, and immediately after activity that aggravates your symptoms. Use ice packs or an ice massage.  Heat treatment may be used before performing stretching and strengthening activities prescribed by your caregiver, physical therapist, or athletic trainer. Use a heat pack or a warm water soak. SEEK MEDICAL CARE IF:   Symptoms get worse or do not improve in 4 to 6 weeks, despite treatment.  New, unexplained symptoms develop. (Drugs used in treatment may produce side effects.) EXERCISES  RANGE OF MOTION (ROM) AND STRETCHING EXERCISES - Impingement Syndrome (Rotator Cuff  Tendinitis, Bursitis) These exercises may help you when beginning to rehabilitate your injury. Your symptoms may go away with or without further involvement from your physician, physical therapist or athletic trainer. While completing these exercises, remember:   Restoring tissue flexibility helps normal motion to return to the joints. This allows healthier, less painful movement and activity.  An effective stretch should be held for at least 30 seconds.  A stretch should never be painful. You should only feel a gentle lengthening or release in the stretched tissue. STRETCH - Flexion, Standing  Stand with good posture. With an underhand grip on your right / left hand, and an overhand grip on the opposite hand, grasp a broomstick or cane so that your hands are a  little more than shoulder width apart.  Keeping your right / left elbow straight and shoulder muscles relaxed, push the stick with your opposite hand, to raise your right / left arm in front of your body and then overhead. Raise your arm until you feel a stretch in your right / left shoulder, but before you have increased shoulder pain.  Try to avoid shrugging your right / left shoulder as your arm rises, by keeping your shoulder blade tucked down and toward your mid-back spine. Hold for __________ seconds.  Slowly return to the starting position. Repeat __________ times. Complete this exercise __________ times per day. STRETCH - Abduction, Supine  Lie on your back. With an underhand grip on your right / left hand and an overhand grip on the opposite hand, grasp a broomstick or cane so that your hands are a little more than shoulder width apart.  Keeping your right / left elbow straight and your shoulder muscles relaxed, push the stick with your opposite hand, to raise your right / left arm out to the side of your body and then overhead. Raise your arm until you feel a stretch in your right / left shoulder, but before you have increased shoulder pain.  Try to avoid shrugging your right / left shoulder as your arm rises, by keeping your shoulder blade tucked down and toward your mid-back spine. Hold for __________ seconds.  Slowly return to the starting position. Repeat __________ times. Complete this exercise __________ times per day. ROM - Flexion, Active-Assisted  Lie on your back.  You may bend your knees for comfort.  Grasp a broomstick or cane so your hands are about shoulder width apart. Your right / left hand should grip the end of the stick, so that your hand is positioned "thumbs-up," as if you were about to shake hands.  Using your healthy arm to lead, raise your right / left arm overhead, until you feel a gentle stretch in your shoulder. Hold for __________ seconds.  Use the stick  to assist in returning your right / left arm to its starting position. Repeat __________ times. Complete this exercise __________ times per day.  ROM - Internal Rotation, Supine   Lie on your back on a firm surface. Place your right / left elbow about 60 degrees away from your side. Elevate your elbow with a folded towel, so that the elbow and shoulder are the same height.  Using a broomstick or cane and your strong arm, pull your right / left hand toward your body until you feel a gentle stretch, but no increase in your shoulder pain. Keep your shoulder and elbow in place throughout the exercise.  Hold for __________ seconds. Slowly return to the starting position. Repeat __________ times. Complete this exercise __________ times per day. STRETCH - Internal Rotation  Place your right / left hand behind your back, palm up.  Throw a towel or belt over your opposite shoulder. Grasp the towel with your right / left hand.  While keeping an upright posture, gently pull up on the towel, until you feel a stretch in the front of your right / left shoulder.  Avoid shrugging your right / left shoulder as your arm rises, by keeping your shoulder blade tucked down and toward your mid-back spine.  Hold for __________ seconds. Release the stretch, by lowering your healthy hand. Repeat __________ times. Complete this exercise __________ times per day. ROM - Internal Rotation   Using an underhand grip, grasp a stick behind your back with both hands.  While standing upright with good posture, slide the stick up your back until you feel a mild stretch in the front of your shoulder.  Hold for __________ seconds. Slowly return to your starting position. Repeat __________ times. Complete this exercise __________ times per day.  STRETCH - Posterior Shoulder Capsule   Stand or sit with good posture. Grasp your right / left elbow and draw it across your chest, keeping it at the same height as your  shoulder.  Pull your elbow, so your upper arm comes in closer to your chest. Pull until you feel a gentle stretch in the back of your shoulder.  Hold for __________ seconds. Repeat __________ times. Complete this exercise __________ times per day. STRENGTHENING EXERCISES - Impingement Syndrome (Rotator Cuff Tendinitis, Bursitis) These exercises may help you when beginning to rehabilitate your injury. They may resolve your symptoms with or without further involvement from your physician, physical therapist or athletic trainer. While completing these exercises, remember:  Muscles can gain both the endurance and the strength needed for everyday activities through controlled exercises.  Complete these exercises as instructed by your physician, physical therapist or athletic trainer. Increase the resistance and repetitions only as guided.  You may experience muscle soreness or fatigue, but the pain or discomfort you are trying to eliminate should never worsen during these exercises. If this pain does get worse, stop and make sure you are following the directions exactly. If the pain is still present after adjustments, discontinue the exercise until you can discuss  the trouble with your clinician.  During your recovery, avoid activity or exercises which involve actions that place your injured hand or elbow above your head or behind your back or head. These positions stress the tissues which you are trying to heal. STRENGTH - Scapular Depression and Adduction   With good posture, sit on a firm chair. Support your arms in front of you, with pillows, arm rests, or on a table top. Have your elbows in line with the sides of your body.  Gently draw your shoulder blades down and toward your mid-back spine. Gradually increase the tension, without tensing the muscles along the top of your shoulders and the back of your neck.  Hold for __________ seconds. Slowly release the tension and relax your muscles  completely before starting the next repetition.  After you have practiced this exercise, remove the arm support and complete the exercise in standing as well as sitting position. Repeat __________ times. Complete this exercise __________ times per day.  STRENGTH - Shoulder Abductors, Isometric  With good posture, stand or sit about 4-6 inches from a wall, with your right / left side facing the wall.  Bend your right / left elbow. Gently press your right / left elbow into the wall. Increase the pressure gradually, until you are pressing as hard as you can, without shrugging your shoulder or increasing any shoulder discomfort.  Hold for __________ seconds.  Release the tension slowly. Relax your shoulder muscles completely before you begin the next repetition. Repeat __________ times. Complete this exercise __________ times per day.  STRENGTH - External Rotators, Isometric  Keep your right / left elbow at your side and bend it 90 degrees.  Step into a door frame so that the outside of your right / left wrist can press against the door frame without your upper arm leaving your side.  Gently press your right / left wrist into the door frame, as if you were trying to swing the back of your hand away from your stomach. Gradually increase the tension, until you are pressing as hard as you can, without shrugging your shoulder or increasing any shoulder discomfort.  Hold for __________ seconds.  Release the tension slowly. Relax your shoulder muscles completely before you begin the next repetition. Repeat __________ times. Complete this exercise __________ times per day.  STRENGTH - Supraspinatus   Stand or sit with good posture. Grasp a __________ weight, or an exercise band or tubing, so that your hand is "thumbs-up," like you are shaking hands.  Slowly lift your right / left arm in a "V" away from your thigh, diagonally into the space between your side and straight ahead. Lift your hand to  shoulder height or as far as you can, without increasing any shoulder pain. At first, many people do not lift their hands above shoulder height.  Avoid shrugging your right / left shoulder as your arm rises, by keeping your shoulder blade tucked down and toward your mid-back spine.  Hold for __________ seconds. Control the descent of your hand, as you slowly return to your starting position. Repeat __________ times. Complete this exercise __________ times per day.  STRENGTH - External Rotators  Secure a rubber exercise band or tubing to a fixed object (table, pole) so that it is at the same height as your right / left elbow when you are standing or sitting on a firm surface.  Stand or sit so that the secured exercise band is at your uninjured side.  [employer]  your right / left elbow 90 degrees. Place a folded towel or small pillow under your right / left arm, so that your elbow is a few inches away from your side.  Keeping the tension on the exercise band, pull it away from your body, as if pivoting on your elbow. Be sure to keep your body steady, so that the movement is coming only from your rotating shoulder.  Hold for __________ seconds. Release the tension in a controlled manner, as you return to the starting position. Repeat __________ times. Complete this exercise __________ times per day.  STRENGTH - Internal Rotators   Secure a rubber exercise band or tubing to a fixed object (table, pole) so that it is at the same height as your right / left elbow when you are standing or sitting on a firm surface.  Stand or sit so that the secured exercise band is at your right / left side.  Bend your elbow 90 degrees. Place a folded towel or small pillow under your right / left arm so that your elbow is a few inches away from your side.  Keeping the tension on the exercise band, pull it across your body, toward your stomach. Be sure to keep your body steady, so that the movement is coming only from  your rotating shoulder.  Hold for __________ seconds. Release the tension in a controlled manner, as you return to the starting position. Repeat __________ times. Complete this exercise __________ times per day.  STRENGTH - Scapular Protractors, Standing   Stand arms length away from a wall. Place your hands on the wall, keeping your elbows straight.  Begin by dropping your shoulder blades down and toward your mid-back spine.  To strengthen your protractors, keep your shoulder blades down, but slide them forward on your rib cage. It will feel as if you are lifting the back of your rib cage away from the wall. This is a subtle motion and can be challenging to complete. Ask your caregiver for further instruction, if you are not sure you are doing the exercise correctly.  Hold for __________ seconds. Slowly return to the starting position, resting the muscles completely before starting the next repetition. Repeat __________ times. Complete this exercise __________ times per day. STRENGTH - Scapular Protractors, Supine  Lie on your back on a firm surface. Extend your right / left arm straight into the air while holding a __________ weight in your hand.  Keeping your head and back in place, lift your shoulder off the floor.  Hold for __________ seconds. Slowly return to the starting position, and allow your muscles to relax completely before starting the next repetition. Repeat __________ times. Complete this exercise __________ times per day. STRENGTH - Scapular Protractors, Quadruped  Get onto your hands and knees, with your shoulders directly over your hands (or as close as you can be, comfortably).  Keeping your elbows locked, lift the back of your rib cage up into your shoulder blades, so your mid-back rounds out. Keep your neck muscles relaxed.  Hold this position for __________ seconds. Slowly return to the starting position and allow your muscles to relax completely before starting the  next repetition. Repeat __________ times. Complete this exercise __________ times per day.  STRENGTH - Scapular Retractors  Secure a rubber exercise band or tubing to a fixed object (table, pole), so that it is at the height of your shoulders when you are either standing, or sitting on a firm armless chair.  With a  palm down grip, grasp an end of the band in each hand. Straighten your elbows and lift your hands straight in front of you, at shoulder height. Step back, away from the secured end of the band, until it becomes tense.  Squeezing your shoulder blades together, draw your elbows back toward your sides, as you bend them. Keep your upper arms lifted away from your body throughout the exercise.  Hold for __________ seconds. Slowly ease the tension on the band, as you reverse the directions and return to the starting position. Repeat __________ times. Complete this exercise __________ times per day. STRENGTH - Shoulder Extensors   Secure a rubber exercise band or tubing to a fixed object (table, pole) so that it is at the height of your shoulders when you are either standing, or sitting on a firm armless chair.  With a thumbs-up grip, grasp an end of the band in each hand. Straighten your elbows and lift your hands straight in front of you, at shoulder height. Step back, away from the secured end of the band, until it becomes tense.  Squeezing your shoulder blades together, pull your hands down to the sides of your thighs. Do not allow your hands to go behind you.  Hold for __________ seconds. Slowly ease the tension on the band, as you reverse the directions and return to the starting position. Repeat __________ times. Complete this exercise __________ times per day.  STRENGTH - Scapular Retractors and External Rotators   Secure a rubber exercise band or tubing to a fixed object (table, pole) so that it is at the height as your shoulders, when you are either standing, or sitting on a  firm armless chair.  With a palm down grip, grasp an end of the band in each hand. Bend your elbows 90 degrees and lift your elbows to shoulder height, at your sides. Step back, away from the secured end of the band, until it becomes tense.  Squeezing your shoulder blades together, rotate your shoulders so that your upper arms and elbows remain stationary, but your fists travel upward to head height.  Hold for __________ seconds. Slowly ease the tension on the band, as you reverse the directions and return to the starting position. Repeat __________ times. Complete this exercise __________ times per day.  STRENGTH - Scapular Retractors and External Rotators, Rowing   Secure a rubber exercise band or tubing to a fixed object (table, pole) so that it is at the height of your shoulders, when you are either standing, or sitting on a firm armless chair.  With a palm down grip, grasp an end of the band in each hand. Straighten your elbows and lift your hands straight in front of you, at shoulder height. Step back, away from the secured end of the band, until it becomes tense.  Step 1: Squeeze your shoulder blades together. Bending your elbows, draw your hands to your chest, as if you are rowing a boat. At the end of this motion, your hands and elbow should be at shoulder height and your elbows should be out to your sides.  Step 2: Rotate your shoulders, to raise your hands above your head. Your forearms should be vertical and your upper arms should be horizontal.  Hold for __________ seconds. Slowly ease the tension on the band, as you reverse the directions and return to the starting position. Repeat __________ times. Complete this exercise __________ times per day.  STRENGTH - Scapular Depressors  Find a sturdy chair  without wheels, such as a dining room chair.  Keeping your feet on the floor, and your hands on the chair arms, lift your bottom up from the seat, and lock your elbows.  Keeping  your elbows straight, allow gravity to pull your body weight down. Your shoulders will rise toward your ears.  Raise your body against gravity by drawing your shoulder blades down your back, shortening the distance between your shoulders and ears. Although your feet should always maintain contact with the floor, your feet should progressively support less body weight, as you get stronger.  Hold for __________ seconds. In a controlled and slow manner, lower your body weight to begin the next repetition. Repeat __________ times. Complete this exercise __________ times per day.  Document Released: 06/26/2005 Document Revised: 09/18/2011 Document Reviewed: 10/08/2008 [facility] Patient Information 2015 [place], [place]. This information is not intended to replace advice given to you by your health care provider. Make sure you discuss any questions you have with your health care provider.  Sciatica Sciatica is pain, weakness, numbness, or tingling along the path of the sciatic nerve. The nerve starts in the lower back and runs down the back of each leg. The nerve controls the muscles in the lower leg and in the back of the knee, while also providing sensation to the back of the thigh, lower leg, and the sole of your foot. Sciatica is a symptom of another medical condition. For instance, nerve damage or certain conditions, such as a herniated disk or bone spur on the spine, pinch or put pressure on the sciatic nerve. This causes the pain, weakness, or other sensations normally associated with sciatica. Generally, sciatica only affects one side of the body. CAUSES   Herniated or slipped disc.  Degenerative disk disease.  A pain disorder involving the narrow muscle in the buttocks (piriformis syndrome).  Pelvic injury or fracture.  Pregnancy.  Tumor (rare). SYMPTOMS  Symptoms can vary from mild to very severe. The symptoms usually travel from the low back to the buttocks and down the back of the leg.  Symptoms can include:  Mild tingling or dull aches in the lower back, leg, or hip.  Numbness in the back of the calf or sole of the foot.  Burning sensations in the lower back, leg, or hip.  Sharp pains in the lower back, leg, or hip.  Leg weakness.  Severe back pain inhibiting movement. These symptoms may get worse with coughing, sneezing, laughing, or prolonged sitting or standing. Also, being overweight may worsen symptoms. DIAGNOSIS  Your caregiver will perform a physical exam to look for common symptoms of sciatica. He or she may ask you to do certain movements or activities that would trigger sciatic nerve pain. Other tests may be performed to find the cause of the sciatica. These may include:  Blood tests.  X-rays.  Imaging tests, such as an MRI or CT scan. TREATMENT  Treatment is directed at the cause of the sciatic pain. Sometimes, treatment is not necessary and the pain and discomfort goes away on its own. If treatment is needed, your caregiver may suggest:  Over-the-counter medicines to relieve pain.  Prescription medicines, such as anti-inflammatory medicine, muscle relaxants, or narcotics.  Applying heat or ice to the painful area.  Steroid injections to lessen pain, irritation, and inflammation around the nerve.  Reducing activity during periods of pain.  Exercising and stretching to strengthen your abdomen and improve flexibility of your spine. Your caregiver may suggest losing weight if the  extra weight makes the back pain worse.  Physical therapy.  Surgery to eliminate what is pressing or pinching the nerve, such as a bone spur or part of a herniated disk. HOME CARE INSTRUCTIONS   Only take over-the-counter or prescription medicines for pain or discomfort as directed by your caregiver.  Apply ice to the affected area for 20 minutes, 3-4 times a day for the first 48-72 hours. Then try heat in the same way.  Exercise, stretch, or perform your usual  activities if these do not aggravate your pain.  Attend physical therapy sessions as directed by your caregiver.  Keep all follow-up appointments as directed by your caregiver.  Do not wear high heels or shoes that do not provide proper support.  Check your mattress to see if it is too soft. A firm mattress may lessen your pain and discomfort. SEEK IMMEDIATE MEDICAL CARE IF:   You lose control of your bowel or bladder (incontinence).  You have increasing weakness in the lower back, pelvis, buttocks, or legs.  You have redness or swelling of your back.  You have a burning sensation when you urinate.  You have pain that gets worse when you lie down or awakens you at night.  Your pain is worse than you have experienced in the past.  Your pain is lasting longer than 4 weeks.  You are suddenly losing weight without reason. MAKE SURE YOU:  Understand these instructions.  Will watch your condition.  Will get help right away if you are not doing well or get worse. Document Released: 06/20/2001 Document Revised: 12/26/2011 Document Reviewed: 11/05/2011 [facility] Patient Information 2015 [place], [place]. This information is not intended to replace advice given to you by your health care provider. Make sure you discuss any questions you have with your health care provider.    Emergency Department Resource Guide 1) Find a Doctor and Pay Out of Pocket Although you won't have to find out who is covered by your insurance plan, it is a good idea to ask around and get recommendations. You will then need to call the office and see if the doctor you have chosen will accept you as a new patient and what types of options they offer for patients who are self-pay. Some doctors offer discounts or will set up payment plans for their patients who do not have insurance, but you will need to ask so you aren't surprised when you get to your appointment.  2) Contact Your Local Health Department Not all  health departments have doctors that can see patients for sick visits, but many do, so it is worth a call to see if yours does. If you don't know where your local health department is, you can check in your phone book. The CDC also has a tool to help you locate your state's health department, and many state websites also have listings of all of their local health departments.  3) Find a Walk-in Clinic If your illness is not likely to be very severe or complicated, you may want to try a walk in clinic. These are popping up all over the country in pharmacies, drugstores, and shopping centers. They're usually staffed by nurse practitioners or physician assistants that have been trained to treat common illnesses and complaints. They're usually fairly quick and inexpensive. However, if you have serious medical issues or chronic medical problems, these are probably not your best option.  No Primary Care Doctor: - Call Health Connect at  [phone] - they can  help you locate a primary care doctor that  accepts your insurance, provides certain services, etc. - Physician Referral Service- [phone]  Chronic Pain Problems: Organization         Address  Phone   Notes  [facility]  [phone] Patients need to be referred by their primary care doctor.   Medication Assistance: Organization         Address  Phone   Notes  [facility] Pass[facility] [address] [place], [place] [address] [phone] --Must be a resident of [facility] [place] -- Must have NO insurance coverage whatsoever (no Medicaid/ Medicare, etc.) -- The pt. MUST have a primary care doctor that directs their care regularly and follows them in the community   MedAssist  [phone]) [phone]   [employer]  [phone]    Agencies that provide inexpensive medical care: Organization         Address  Phone   Notes  [facility]  [phone]   [facility]     [phone]   [facility] [address] [place], [place] [id number] [phone]) [phone]   [facility] [address], [place] [phone]) [phone]   Planned Parenthood    [phone]   [facility]    [phone]   [facility] and [facility]  [address], [place] Phone:  [phone], Fax:  [phone] Hours of Operation:  9 am - 6 pm, M-F.  Also accepts Medicaid/Medicare and self-pay.  [facility]  [address], [place] Phone: [phone]) [phone], Fax: [phone]. Hours of Operation:  8:30 am - 5:30 pm, M-F.  Also accepts Medicaid and self-pay.  [facility] [place] [address], [place] Phone: [phone]) [phone]   [facility] [address], [place], [place] [phone], Ext. [phone] Mondays & Thursdays: 7-9 AM.  First 15 patients are seen on a first come, first serve basis.    Medicaid-accepting [facility] Providers:  Organization         Address  Phone   Notes  [facility] [address], [place] [phone]) [phone] Also accepts self-pay patients.  [facility] [address] [place], [place]  [phone]) [phone]   [facility] [address], [place] [phone]   [facility] [address] [place] [address], [place] [phone]   Renaye RakersVeita Bland [address], [place]   [phone] Only accepts [place] Access [place] patients after they have their name applied to their card.   Self-Pay (no insurance) in [facility] [place]:  Organization         Address  Phone   Notes  Sickle Cell Patients, [facility] [address], [place] [phone]   [facility] [address], [place] [phone]   [facility] [place]  [address],  [phone]     [facility]/Dr. Osei-Bonsu  [address] [place] [address], [place] or [phone], [place] [phone]) [phone] Phone number for both [place] and [place] locations is the same.  [facility] [address], [place] [phone]   [facility] [place] [address] [place] [address], [place] or [address] [phone]) [phone]   [facility] [address], [place] [phone]) [phone], phone; [phone], fax Sees patients 1st and 3rd Saturday of every month.  Must not qualify for public or private insurance (i.e. Medicaid, Medicare, [place] Health Choice, Veterans' Benefits)  Household income should be no more than 200% of the poverty level The clinic cannot treat you if you are pregnant or think you are pregnant  Sexually transmitted diseases are not treated at the clinic.    Dental Care: Organization         Address  Phone  Notes  [facility] of [facility] [address], [place] [phone] Accepts children up to age 35 who are enrolled in [place]icaid or [place] Health Choice; pregnant women with a Medicaid card; and children who have applied for Medicaid or [place] Health Choice, but were declined, whose parents can pay a reduced fee at time of service.  [facility] of [facility] [place]  [address], [place] [phone] Accepts children up to age 35 who are enrolled in [place]icaid or [place] Health Choice; pregnant women with a Medicaid card; and children who have applied for Medicaid or [place] Health Choice, but were declined, whose parents can pay a reduced fee at time of service.  [facility]  [address], [place] [phone]) [phone] Patients are seen by appointment only. Walk-ins are not accepted. [facility] will see patients 35 years of age and older. Monday - Tuesday (8am-5pm) Most Wednesdays (8:30-5pm) $30 per visit,  cash only  [facility]  [address], [facility] [phone] Patients are seen by appointment only. Walk-ins are not accepted. [facility] will see patients 35 years of age and older. One Wednesday Evening (Monthly: Volunteer Based).  $30 per visit, cash only  [facility]  [phone] for adults; Children under age 504, call Graduate Pediatric Dentistry at [phone]) [phone]. Children aged 744-14, please call [phone] to request a pediatric application.  Dental services are provided in all areas of dental care including fillings, crowns and bridges, complete and partial dentures, implants, gum treatment, root canals, and extractions. Preventive care is also provided. Treatment is provided to both adults and children. Patients are selected via a lottery and there is often a waiting list.   [facility] [address], [place]  [phone] [email or web address]   [facility] [address], [place], [place] [phone], Ext. [phone] Second and Fourth Thursday of each month, opens at 6:30 AM; Clinic ends at 9 AM.  Patients are seen on a first-come first-served basis, and a limited number are seen during each clinic.   [facility]  [address], [place], [place] [phone]   Eligibility Requirements You must have lived in [place], [place], or [place] counties for at least the last three months.   You cannot be eligible for state or federal sponsored National Cityhealthcare insurance, including CIGNAVeterans Administration, [place]icaid, or [employer]Medicare.   You generally cannot be eligible for healthcare insurance through your employer.    How to apply: Eligibility screenings are held every Tuesday and Wednesday afternoon from 1:00 pm until 4:00 pm. You do not need an appointment for the interview!  [facility] [address], [place], [place] [phone]   [facility]   [phone]   [facility]  [facility]  [phone]   [facility]  [phone]    Behavioral Health Resources in the Community: Intensive Outpatient Programs Organization         Address  Phone  Notes  [facility] [address] [place], [place] [phone]   [facility] Outpatient [address], [place], [place] [phone]   [facility] [address], [place], [place]  [phone]   [facility] [address],  [place], [place] [phone] or [phone]   Substance Abuse Resources Organization         Address  Phone  Notes  Alcohol and Drug Services  [phone]   [facility]  7243201508   [facility]  [phone]   Floydene Flock  [phone]   Residential & Outpatient Substance Abuse Program  [phone]   Psychological Services Organization         Address  Phone  Notes  [facility]  [phone]   [facility] Services  [phone]   [facility] [address], [place] [phone] or [phone]    Mobile Crisis Teams Organization         Address  Phone  Notes  [facility], Mobile Crisis Care Unit  [phone]   [facility]  [address] [place], [place] [phone]   Doristine Locks [address] [place] [phone]    Self-Help/Support Groups Organization         Address  Phone             Notes  [facility] of [place] - variety of support groups  336- I7437963 Call for more information  Narcotics Anonymous (NA), [facility] [address], [employer] [place]  2 meetings at this location   Statistician         Address  Phone  Notes  [facility] [address],    [place]  [phone]   [facility]  [address], [place] [address], [place], [place] [phone]    [facility] [address] [place], [place] [phone] Admissions: 8am-3pm M-F  [facility] [address],    [place], [place] [phone]   [facility] [address] [place], [place], [place] [phone]   [facility] [address]  [place], [place] [phone]   [facility] - Intensive Outpatient [address], [place], [place] [phone]   [facility] ([facility] [address]  [place], [place] [phone] or [phone]   [facility] (RTS) [address] [place], [place] [phone] Accepts Medicaid  [facility] [place] [address],  [place] [phone] Substance Abuse/Addiction Treatment   [facility] Organization         Address  Phone  Notes  [facility]  904-534-1883   Angie Fava, PhD [address] Kn[address] [place], [place]   [phone] or 7602273173   [facility]   [address] [place], [place] [phone]   [facility] [address], [place], [place] [phone] Insurance/Medicaid/sponsorship through [employer] and Families [address]  [place], [place] [phone] [place], [place] [phone]    Dr. Adele Schilder  [phone]   [facility] Dept. 1) [address], [place] 2) [place] 3)  [address], [place] [phone]  [phone]   [place] [phone] or [phone] (After Hours)

## 2014-06-15 NOTE — ED Provider Notes (Signed)
CSN: [id number]     Arrival date & time 06/15/14  1427 History  This chart was scribed for non-physician practitioner, Terri Piedraourtney Forcucci, PA-C working with Rolan BuccoMelanie Belfi, MD by Greggory StallionKayla Andersen, ED scribe. This patient was seen in room WTR7/WTR7 and the patient's care was started at 3:56 PM.  Chief Complaint  Patient presents with  . Back Pain  . Arm Pain   The history is provided by the patient. No language interpreter was used.    HPI Comments: Cheryl Fox is a 33 y.o. female with history of bulging discs who presents to the Emergency Department complaining of mid to lower back pain that radiates into her left leg that started 3 weeks ago. Denies injury. Denies history of back surgery. The last time she had imaging on her back was about 2 years ago. States she had nausea and fever over the last week due to a virus but states it is resolved now. Denies chills, emesis, bowel or bladder incontinence, saddle anesthesia. Denies history of cancer or IV drug use. Denies history of frequent fractures or brittle bone diseases. Pt is also complaining of continued right shoulder pain that radiates into her arm after a right humeral fracture and torn rotator cuff earlier this year. [facility] and was told she needed physical therapy but didn't have insurance so she couldn't do it.   Past Medical History  Diagnosis Date  . Hepatitis C   . Acid reflux    Past Surgical History  Procedure Laterality Date  . Cholecystectomy     History reviewed. No pertinent family history. History  Substance Use Topics  . Smoking status: Current Every Day Smoker -- 0.50 packs/day    Types: Cigarettes  . Smokeless tobacco: Not on file  . Alcohol Use: No     Comment: occasion   OB History    No data available     Review of Systems  Constitutional: Negative for fever and chills.  Gastrointestinal: Negative for nausea and vomiting.  Genitourinary:       Negative for bowel or bladder  incontinence.  Musculoskeletal: Positive for myalgias, back pain and arthralgias.  Neurological: Negative for numbness.  All other systems reviewed and are negative.  Allergies  Celecoxib; Motrin; Penicillins; Tramadol; and Naproxen  Home Medications   Prior to Admission medications   Medication Sig Start Date End Date Taking? Authorizing Provider  acetaminophen (TYLENOL) 160 MG/5ML elixir Take 15.6 mLs (500 mg total) by mouth every 6 (six) hours as needed for fever or pain. 04/24/14   Mercedes Strupp Camprubi-Soms, PA-C  aspirin-acetaminophen-caffeine (EXCEDRIN MIGRAINE) [phone] MG per tablet Take 1 tablet by mouth every 8 (eight) hours as needed for migraine.    Historical Provider, MD  cyclobenzaprine (FLEXERIL) 10 MG tablet Take 1 tablet (10 mg total) by mouth 2 (two) times daily as needed for muscle spasms. 06/15/14   Fayette Gasner A Forcucci, PA-C  HYDROcodone-acetaminophen (NORCO/VICODIN) 5-325 MG per tablet Take 1 tablet by mouth every 6 (six) hours as needed for moderate pain or severe pain. 06/15/14   Sonal Dorwart A Forcucci, PA-C  omeprazole (PRILOSEC) 20 MG capsule Take 20 mg by mouth 2 (two) times daily.    Historical Provider, MD  omeprazole (PRILOSEC) 20 MG capsule Take 1 capsule (20 mg total) by mouth daily. 04/24/14   Mercedes Strupp Camprubi-Soms, PA-C  predniSONE (DELTASONE) 10 MG tablet Day 1 take 6 pills Day 2 take 5 pills Day 3 take 4 pills Day 4 take 3  pills Day 5 take 2 pills Day 6 take 1 pill 06/15/14   Tova Vater A Forcucci, PA-C   BP 146/91 mmHg  Pulse 100  Temp(Src) 97.9 F (36.6 C) (Oral)  Resp 16  SpO2 98%  LMP 05/16/2014   Physical Exam  Constitutional: She is oriented to person, place, and time. She appears well-developed and well-nourished. No distress.  HENT:  Head: Normocephalic and atraumatic.  Nose: Nose normal.  Mouth/Throat: Oropharynx is clear and moist.  Eyes: Conjunctivae and EOM are normal. Pupils are equal, round, and reactive to light.  Neck:  Neck supple. No tracheal deviation present.  Cardiovascular: Normal rate, regular rhythm and normal heart sounds.  Exam reveals no gallop and no friction rub.   No murmur heard. Pulmonary/Chest: Effort normal and breath sounds normal. No respiratory distress. She has no wheezes. She has no rhonchi. She has no rales.  Musculoskeletal:       Right shoulder: She exhibits decreased range of motion, tenderness, bony tenderness, pain and decreased strength. She exhibits no swelling, no effusion, no crepitus, no deformity, no laceration, no spasm and normal pulse.  Patient rises slowly from sitting to standing.  They walk without an antalgic gait.  There is no evidence of erythema, ecchymosis, or gross deformity.  There is tenderness to palpation over the thoracic spine, lumbar spine, thoracic paraspinal muscles and lumbar paraspinal muscles.  Active ROM is limited due to pain.  Sensation to light touch is intact over all extremities.  Strength is symmetric and equal in all extremities.  DTRs are equal and symmetric in all extremities.   Neurological: She is alert and oriented to person, place, and time. She has normal strength. No cranial nerve deficit or sensory deficit.  Skin: Skin is warm and dry.  Psychiatric: She has a normal mood and affect. Her behavior is normal.  Nursing note and vitals reviewed.   ED Course  Procedures (including critical care time)  DIAGNOSTIC STUDIES: Oxygen Saturation is 98% on RA, normal by my interpretation.    COORDINATION OF CARE: 4:07 PM-Discussed treatment plan which includes prednisone, hydrocodone and flexeril with pt at bedside and pt agreed to plan.   Labs Review Labs Reviewed - No data to display  Imaging Review Dg Humerus Right  06/15/2014   CLINICAL DATA:  Larey Seat on 10/14/2013. Pain began worsening on 06/13/2014. Generalized pain.  EXAM: RIGHT HUMERUS - 2+ VIEW  COMPARISON:  11/11/2013  FINDINGS: No evidence of acute fracture or focal lesion. Cystic  change in the greater tuberosity of the humerus probably relates to chronic rotator cuff disease.  IMPRESSION: No acute bony finding.   Electronically Signed   By: Paulina Fusi M.D.   On: 06/15/2014 16:06     EKG Interpretation None      MDM   Final diagnoses:  Midline low back pain with left-sided sciatica  Right shoulder pain   Patient is a 33 year old female who presents emergency room for evaluation of back pain and right shoulder pain. Patient has no red flags for cauda equina at this time. Physical exam reveals no focal neurological deficits. Suspect that this may be sciatica from her degenerative disc disease. We will treat with prednisone, Flexeril, and hydrocodone. Right shoulder exam reveals neurovascularly intact right arm with some weakness in the right shoulder. Suspect that this may be rotator cuff tendinitis versus partial rotator cuff tear versus arthritis due to previous humeral fracture injury. Humeral x-ray reveals no acute bony abnormalities. I will have the [facility]. If she has no improvement she likely needs to see an orthopedist. Patient to return for signs of cauda equina. She states understanding and agreement at this time. Patient is stable for discharge.  I personally performed the services described in this documentation, which was scribed in my presence. The recorded information has been reviewed and is accurate.  Eben Burowourtney A Forcucci, PA-C 06/15/14 1632  Lyanne CoKevin M Campos, MD 06/16/14 [phone]

## 2014-08-27 ENCOUNTER — Encounter (HOSPITAL_COMMUNITY): Payer: Self-pay

## 2014-08-27 ENCOUNTER — Emergency Department (HOSPITAL_COMMUNITY)
Admission: EM | Admit: 2014-08-27 | Discharge: 2014-08-27 | Disposition: A | Discharge status: Home / Self Care | Payer: Medicaid Other | Attending: Emergency Medicine | Admitting: Emergency Medicine

## 2014-08-27 DIAGNOSIS — Z880 Allergy status to penicillin: Secondary | ICD-10-CM | POA: Diagnosis not present

## 2014-08-27 DIAGNOSIS — M5442 Lumbago with sciatica, left side: Secondary | ICD-10-CM

## 2014-08-27 DIAGNOSIS — Z79899 Other long term (current) drug therapy: Secondary | ICD-10-CM | POA: Insufficient documentation

## 2014-08-27 DIAGNOSIS — Z8619 Personal history of other infectious and parasitic diseases: Secondary | ICD-10-CM | POA: Insufficient documentation

## 2014-08-27 DIAGNOSIS — Z7952 Long term (current) use of systemic steroids: Secondary | ICD-10-CM | POA: Insufficient documentation

## 2014-08-27 DIAGNOSIS — Z720 Tobacco use: Secondary | ICD-10-CM | POA: Diagnosis not present

## 2014-08-27 DIAGNOSIS — R200 Anesthesia of skin: Secondary | ICD-10-CM | POA: Diagnosis not present

## 2014-08-27 DIAGNOSIS — M545 Low back pain: Secondary | ICD-10-CM | POA: Diagnosis present

## 2014-08-27 DIAGNOSIS — M542 Cervicalgia: Secondary | ICD-10-CM | POA: Insufficient documentation

## 2014-08-27 DIAGNOSIS — G8929 Other chronic pain: Secondary | ICD-10-CM | POA: Insufficient documentation

## 2014-08-27 DIAGNOSIS — K219 Gastro-esophageal reflux disease without esophagitis: Secondary | ICD-10-CM | POA: Insufficient documentation

## 2014-08-27 DIAGNOSIS — M546 Pain in thoracic spine: Secondary | ICD-10-CM | POA: Insufficient documentation

## 2014-08-27 MED ORDER — PREDNISONE 20 MG PO TABS: 40.0000 mg | ORAL_TABLET | Freq: Every day | ORAL | Status: DC

## 2014-08-27 MED ORDER — HYDROCODONE-ACETAMINOPHEN 5-325 MG PO TABS
2.0000 | ORAL_TABLET | Freq: Once | ORAL | Status: AC
  Administered 2014-08-27: 2 via ORAL
  Filled 2014-08-27: qty 2

## 2014-08-27 MED ORDER — CYCLOBENZAPRINE HCL 10 MG PO TABS: 10.0000 mg | ORAL_TABLET | Freq: Two times a day (BID) | ORAL | Status: DC | PRN

## 2014-08-27 MED ORDER — HYDROCODONE-ACETAMINOPHEN 5-325 MG PO TABS: 1.0000 | ORAL_TABLET | ORAL | Status: DC | PRN

## 2014-08-27 NOTE — ED Provider Notes (Signed)
CSN: [id number]     Arrival date & time 08/27/14  1102 History  This chart was scribed for non-physician practitioner [name], PA-C, working with [name]. [name], MD by [name], ED Scribe. This patient was seen in room TR11C/TR11C and the patient's care was started at 12:10 PM.    No chief complaint on file.  The history is provided by the patient. No language interpreter was used.   HPI Comments: Cheryl Fox is a 34 y.o. female with a hx of chronic back pain with left-sided sciatica who presents to the Emergency Department complaining of gradual onset, constant back pain radiating up on her right side into the right shoulder that started 1 week ago. Patient also reports having associated right-sided neck stiffness and hand numbness that started with onset of back pain. She reports difficulty bending, sitting, and standing due to the pain. Laying down with her legs elevated provides some relief to the pain. She has ice for her pain but without relief. Patient denies weakness, fever, nausea, vomiting, and loss of bowel or bladder control. She also denies any injury. Patient has had similar back pain before, but she states it has never radiated up to her shoulder. She has been on Vicodin and percocet in the past for back pain. Patient reports having hx of back pain since 3 years ago, but the pain has worsened following a fall that caused an injury to her right shoulder/arm.  No PCP per patient. She does not have insurance currently. She just recently switched counties and [place] get an [place] card.  Past Medical History  Diagnosis Date  . Hepatitis C   . Acid reflux    Past Surgical History  Procedure Laterality Date  . Cholecystectomy     History reviewed. No pertinent family history. History  Substance Use Topics  . Smoking status: Current Every Day Smoker -- 0.50 packs/day    Types: Cigarettes  . Smokeless tobacco: Not on file  . Alcohol Use: No     Comment: occasion    OB History    No data available     Review of Systems  Constitutional: Negative for fever.  Gastrointestinal: Negative for nausea and vomiting.  Genitourinary: Negative for enuresis.  Musculoskeletal: Positive for back pain, arthralgias and neck stiffness.  Neurological: Positive for numbness. Negative for weakness.      Allergies  Celecoxib; Motrin; Penicillins; Tramadol; and Naproxen  Home Medications   Prior to Admission medications   Medication Sig Start Date End Date Taking? Authorizing Provider  acetaminophen (TYLENOL) 160 MG/5ML elixir Take 15.6 mLs (500 mg total) by mouth every 6 (six) hours as needed for fever or pain. 04/24/14   Mercedes Strupp Camprubi-Soms, PA-C  aspirin-acetaminophen-caffeine (EXCEDRIN MIGRAINE) [phone] MG per tablet Take 1 tablet by mouth every 8 (eight) hours as needed for migraine.    Historical Provider, MD  cyclobenzaprine (FLEXERIL) 10 MG tablet Take 1 tablet (10 mg total) by mouth 2 (two) times daily as needed for muscle spasms. 06/15/14   Courtney A Forcucci, PA-C  cyclobenzaprine (FLEXERIL) 10 MG tablet Take 1 tablet (10 mg total) by mouth 2 (two) times daily as needed for muscle spasms. 08/27/14   Monte Fantasia, PA-C  HYDROcodone-acetaminophen (NORCO/VICODIN) 5-325 MG per tablet Take 1 tablet by mouth every 6 (six) hours as needed for moderate pain or severe pain. 06/15/14   Courtney A Forcucci, PA-C  HYDROcodone-acetaminophen (NORCO/VICODIN) 5-325 MG per tablet Take 1-2 tablets by mouth every 4 (  four) hours as needed for moderate pain or severe pain. 08/27/14   Monte FantasiaJoseph W Kincaid Tiger, PA-C  omeprazole (PRILOSEC) 20 MG capsule Take 20 mg by mouth 2 (two) times daily.    Historical Provider, MD  omeprazole (PRILOSEC) 20 MG capsule Take 1 capsule (20 mg total) by mouth daily. 04/24/14   Mercedes Strupp Camprubi-Soms, PA-C  predniSONE (DELTASONE) 10 MG tablet Day 1 take 6 pills Day 2 take 5 pills Day 3 take 4 pills Day 4 take 3 pills Day 5 take 2  pills Day 6 take 1 pill 06/15/14   Courtney A Forcucci, PA-C  predniSONE (DELTASONE) 20 MG tablet Take 2 tablets (40 mg total) by mouth daily. 08/27/14   Monte FantasiaJoseph W Crisol Muecke, PA-C   BP 144/83 mmHg  Pulse 78  Temp(Src) 98.3 F (36.8 C) (Oral)  Resp 16  Ht 5\' 7"  (1.702 m)  Wt 280 lb (127.007 kg)  BMI 43.84 kg/m2  SpO2 100%  LMP 08/10/2014 (Approximate) Physical Exam  Constitutional: She is oriented to person, place, and time. She appears well-developed and well-nourished. No distress.  HENT:  Head: Normocephalic and atraumatic.  Mouth/Throat: Oropharynx is clear and moist. No oropharyngeal exudate.  Eyes: Pupils are equal, round, and reactive to light.  Neck: Normal range of motion. Neck supple. Muscular tenderness present. No spinous process tenderness present. No rigidity. Normal range of motion present.    Cardiovascular: Normal rate.   Pulmonary/Chest: Effort normal.  Musculoskeletal: She exhibits tenderness. She exhibits no edema.       Back:  Diffuse L1-L5 lumbar spinous process tenderness. T6-T10 para-spinous tenderness. Tenderness noted throughout her upper trapezius muscle with range of motion. Patient has full range of motion of neck, back.  Neurological: She is alert and oriented to person, place, and time. She has normal strength. No cranial nerve deficit or sensory deficit. She displays a negative Romberg sign. Gait normal. GCS eye subscore is 4. GCS verbal subscore is 5. GCS motor subscore is 6.  Patient fully alert, answering questions appropriately in full, clear sentences. Cranial nerves II through XII grossly intact. Motor strength 5 out of 5 in all major muscle groups of upper and lower extremity. Distal sensation intact. Gait normal, patient able to ambulate without difficulty.  Skin: Skin is warm and dry. No rash noted.  Psychiatric: She has a normal mood and affect. Her behavior is normal.  Nursing note and vitals reviewed.   ED Course  Procedures  DIAGNOSTIC  STUDIES: Oxygen Saturation is 97% on room air, normal by my interpretation.    COORDINATION OF CARE: 12:19 PM-Discussed treatment plan which includes pain medication, prednisone and muscle relaxer with pt at bedside and pt agreed to plan.    Labs Review Labs Reviewed - No data to display  Imaging Review No results found.   EKG Interpretation None      MDM   Final diagnoses:  Midline low back pain with left-sided sciatica    Patient with back pain which she states is consistent with her chronic pain, exacerbated over the past week without new injury.  No neurological deficits and normal neuro exam.  Patient can walk but states is painful.  No loss of bowel or bladder control.  No concern for cauda equina.  No fever, night sweats, weight loss, h/o cancer, IVDU.  RICE protocol and pain medicine indicated and discussed with patient.   I personally performed the services described in this documentation, which was scribed in my presence. The recorded information has been reviewed  and is accurate.  BP 144/83 mmHg  Pulse 78  Temp(Src) 98.3 F (36.8 C) (Oral)  Resp 16  Ht  (1.702 m)  Wt 280 lb (127.007 kg)  BMI 43.84 kg/m2  SpO2 100%  LMP 08/10/2014 (Approximate)  Signed,  Ladona Mow, PA-C 2:19 PM    Monte Fantasia, PA-C 08/27/14 1415  Monte Fantasia, PA-C 08/27/14 1419  [name]. [name], MD 08/27/14 [phone]

## 2014-08-27 NOTE — Discharge Instructions (Signed)
Sciatica °Sciatica is pain, weakness, numbness, or tingling along the path of the sciatic nerve. The nerve starts in the lower back and runs down the back of each leg. The nerve controls the muscles in the lower leg and in the back of the knee, while also providing sensation to the back of the thigh, lower leg, and the sole of your foot. Sciatica is a symptom of another medical condition. For instance, nerve damage or certain conditions, such as a herniated disk or bone spur on the spine, pinch or put pressure on the sciatic nerve. This causes the pain, weakness, or other sensations normally associated with sciatica. Generally, sciatica only affects one side of the body. °CAUSES  °· Herniated or slipped disc. °· Degenerative disk disease. °· A pain disorder involving the narrow muscle in the buttocks (piriformis syndrome). °· Pelvic injury or fracture. °· Pregnancy. °· Tumor (rare). °SYMPTOMS  °Symptoms can vary from mild to very severe. The symptoms usually travel from the low back to the buttocks and down the back of the leg. Symptoms can include: °· Mild tingling or dull aches in the lower back, leg, or hip. °· Numbness in the back of the calf or sole of the foot. °· Burning sensations in the lower back, leg, or hip. °· Sharp pains in the lower back, leg, or hip. °· Leg weakness. °· Severe back pain inhibiting movement. °These symptoms may get worse with coughing, sneezing, laughing, or prolonged sitting or standing. Also, being overweight may worsen symptoms. °DIAGNOSIS  °Your caregiver will perform a physical exam to look for common symptoms of sciatica. He or she may ask you to do certain movements or activities that would trigger sciatic nerve pain. Other tests may be performed to find the cause of the sciatica. These may include: °· Blood tests. °· X-rays. °· Imaging tests, such as an MRI or CT scan. °TREATMENT  °Treatment is directed at the cause of the sciatic pain. Sometimes, treatment is not necessary  and the pain and discomfort goes away on its own. If treatment is needed, your caregiver may suggest: °· Over-the-counter medicines to relieve pain. °· Prescription medicines, such as anti-inflammatory medicine, muscle relaxants, or narcotics. °· Applying heat or ice to the painful area. °· Steroid injections to lessen pain, irritation, and inflammation around the nerve. °· Reducing activity during periods of pain. °· Exercising and stretching to strengthen your abdomen and improve flexibility of your spine. Your caregiver may suggest losing weight if the extra weight makes the back pain worse. °· Physical therapy. °· Surgery to eliminate what is pressing or pinching the nerve, such as a bone spur or part of a herniated disk. °HOME CARE INSTRUCTIONS  °· Only take over-the-counter or prescription medicines for pain or discomfort as directed by your caregiver. °· Apply ice to the affected area for 20 minutes, 3-4 times a day for the first 48-72 hours. Then try heat in the same way. °· Exercise, stretch, or perform your usual activities if these do not aggravate your pain. °· Attend physical therapy sessions as directed by your caregiver. °· Keep all follow-up appointments as directed by your caregiver. °· Do not wear high heels or shoes that do not provide proper support. °· Check your mattress to see if it is too soft. A firm mattress may lessen your pain and discomfort. °SEEK IMMEDIATE MEDICAL CARE IF:  °· You lose control of your bowel or bladder (incontinence). °· You have increasing weakness in the lower back, pelvis, buttocks,   or legs. °· You have redness or swelling of your back. °· You have a burning sensation when you urinate. °· You have pain that gets worse when you lie down or awakens you at night. °· Your pain is worse than you have experienced in the past. °· Your pain is lasting longer than 4 weeks. °· You are suddenly losing weight without reason. °MAKE SURE YOU: °· Understand these  instructions. °· Will watch your condition. °· Will get help right away if you are not doing well or get worse. °Document Released: 06/20/2001 Document Revised: 12/26/2011 Document Reviewed: 11/05/2011 °ExitCare® Patient Information ©2015 [employer]. This information is not intended to replace advice given to you by your health care provider. Make sure you discuss any questions you have with your health care provider. ° ° °Back Exercises °Back exercises help treat and prevent back injuries. The goal of back exercises is to increase the strength of your abdominal and back muscles and the flexibility of your back. These exercises should be started when you no longer have back pain. Back exercises include: °· Pelvic Tilt. Lie on your back with your knees bent. Tilt your pelvis until the lower part of your back is against the floor. Hold this position 5 to 10 sec and repeat 5 to 10 times. °· Knee to Chest. Pull first 1 knee up against your chest and hold for 20 to 30 seconds, repeat this with the other knee, and then both knees. This may be done with the other leg straight or bent, whichever feels better. °· Sit-Ups or Curl-Ups. Bend your knees 90 degrees. Start with tilting your pelvis, and do a partial, slow sit-up, lifting your trunk only 30 to 45 degrees off the floor. Take at least 2 to 3 seconds for each sit-up. Do not do sit-ups with your knees out straight. If partial sit-ups are difficult, simply do the above but with only tightening your abdominal muscles and holding it as directed. °· Hip-Lift. Lie on your back with your knees flexed 90 degrees. Push down with your feet and shoulders as you raise your hips a couple inches off the floor; hold for 10 seconds, repeat 5 to 10 times. °· Back arches. Lie on your stomach, propping yourself up on bent elbows. Slowly press on your hands, causing an arch in your low back. Repeat 3 to 5 times. Any initial stiffness and discomfort should lessen with repetition over  time. °· Shoulder-Lifts. Lie face down with arms beside your body. Keep hips and torso pressed to floor as you slowly lift your head and shoulders off the floor. °Do not overdo your exercises, especially in the beginning. Exercises may cause you some mild back discomfort which lasts for a few minutes; however, if the pain is more severe, or lasts for more than 15 minutes, do not continue exercises until you see your caregiver. Improvement with exercise therapy for back problems is slow.  °See your caregivers for assistance with developing a proper back exercise program. °Document Released: 08/03/2004 Document Revised: 09/18/2011 Document Reviewed: 04/27/2011 °ExitCare® Patient Information ©2015 [employer]. This information is not intended to replace advice given to you by your health care provider. Make sure you discuss any questions you have with your health care provider. ° ° °Emergency Department Resource Guide °1) Find a Doctor and Pay Out of Pocket °Although you won't have to find out who is covered by your insurance plan, it is a good idea to ask around and get recommendations. You   will then need to call the office and see if the doctor you have chosen will accept you as a new patient and what types of options they offer for patients who are self-pay. Some doctors offer discounts or will set up payment plans for their patients who do not have insurance, but you will need to ask so you aren't surprised when you get to your appointment. ° °2) Contact Your Local Health Department °Not all health departments have doctors that can see patients for sick visits, but many do, so it is worth a call to see if yours does. If you don't know where your local health department is, you can check in your phone book. The CDC also has a tool to help you locate your state's health department, and many state websites also have listings of all of their local health departments. ° °3) Find a Walk-in Clinic °If your illness is  not likely to be very severe or complicated, you may want to try a walk in clinic. These are popping up all over the country in pharmacies, drugstores, and shopping centers. They're usually staffed by nurse practitioners or physician assistants that have been trained to treat common illnesses and complaints. They're usually fairly quick and inexpensive. However, if you have serious medical issues or chronic medical problems, these are probably not your best option. ° °No Primary Care Doctor: °- Call [facility] at  832-8000 - they can help you locate a primary care doctor that  accepts your insurance, provides certain services, etc. °- Physician Referral Service- 1-800-533-3463 ° °Chronic Pain Problems: °Organization         Address  Phone   Notes  °[facility]  (336) 297-2271 Patients need to be referred by their primary care doctor.  ° °Medication Assistance: °Organization         Address  Phone   Notes  °[place] [facility] [address] °[place], [place] [address] (336) 641-8030 --Must be a resident of [place] °-- Must have NO insurance coverage whatsoever (no Medicaid/ Medicare, etc.) °-- The pt. MUST have a primary care doctor that directs their care regularly and follows them in the community °  °[facility]  (866) 331-1348   °[employer]  (888) 892-1162   ° °Agencies that provide inexpensive medical care: °Organization         Address  Phone   Notes  °[facility]  (336) 832-8035   °[facility]    (336) 832-7272   °[facility] [address] °[place], [place] [address] (336) 832-4777   °[facility] [address], °[place] (336) 271-4999   °[facility]    (336) 373-0678   °[facility]    (336) 272-1050   °[facility] ° [address], [place] Phone:  (336) 832-4444, Fax:  (336) 832-4440 Hours of Operation:  9 am - 6  pm, M-F.  Also accepts Medicaid/Medicare and self-pay.  °[facility] ° [address], [place] Phone: (336) [phone], Fax: (336) [phone]. Hours of Operation:  8:30 am - 5:30 pm, M-F.  Also accepts Medicaid and self-pay.  °[facility] [place] [address], [place] Phone: (336) 878-6027   °[facility] [address], [place], [place] (336)723-1848, Ext. 123 Mondays & Thursdays: 7-9 AM.  First 15 patients are seen on a first come, first   serve basis. °  ° °Medicaid-accepting [place] Providers: ° °Organization         Address  Phone   Notes  °[facility] [address], [place] (336) 641-2100 Also accepts self-pay patients.  °[facility] [address], [place] ° (336) 856-9996   °[facility] [address], [place] (336) 288-8857   °[facility] [address] [place] [address], [place] (336) 299-7000   °Veita Bland [address], [place]  ° (336) 373-1557 Only accepts [place] Access Medicaid patients after they have their name applied to their card.  ° °Self-Pay (no insurance) in [place]: ° °Organization         Address  Phone   Notes  °Sickle Cell Patients, [facility] [address], [place] (336) 832-1970   °[facility] [address], [place] (336) 832-4400   °[facility] ° [address],  (336) 992-4800   °[facility]/Dr. Osei-Bonsu ° [address] [place] [address], [place] or [address], [place] (336) [phone] Phone number for both [place] and [place] locations is the same.  °[facility] [address], [place] (336) 299-0000   °[facility] [address] [place] [address], [place] or [address] (336) 852-7530 °(336) 878-2260   °[facility] [address], [place] (336) 350-1642, phone; (336) 294-5005, fax Sees patients 1st and 3rd Saturday of every month.  Must not qualify for public or private insurance (i.e. Medicaid, Medicare, [place] Health Choice, Veterans' Benefits) • Household income should be no more than 200% of the poverty level •The clinic cannot treat you if you are pregnant or think you are pregnant • Sexually transmitted diseases are not treated at the clinic.  ° ° °Dental Care: °Organization         Address  Phone  Notes  °[facility] [address],  (336) 641-6152 Accepts children up to age 21 who are enrolled in Medicaid or [place] Health Choice; pregnant women with a Medicaid card; and children who have applied for Medicaid or [place] Health Choice, but were declined, whose parents can pay a reduced fee at time of service.  °[facility] [place]  [address], [place] (336) 641-7733 Accepts children up to age 21 who are enrolled in Medicaid or [place] Health Choice; pregnant women with a Medicaid card; and children who have applied for Medicaid or [place] Health Choice, but were declined, whose parents can pay a reduced fee at time of service.  °[facility] ° [address],  (336) 641-4533 Patients are seen by appointment only. Walk-ins are not accepted. [facility] will see patients 18 years of age and older. °Monday - Tuesday (8am-5pm) °Most Wednesdays (8:30-5pm) °$30 per visit, cash only  °[facility] ° [address], [place] (336) 641-4533 Patients are seen by appointment only. Walk-ins are not accepted. [facility] will see patients 18 years of age and older. °One Wednesday Evening (Monthly: Volunteer Based).  $30 per visit, cash only  °[facility]  (919) [phone] for adults; Children under age 4, call Graduate Pediatric Dentistry at ([phone]  [phone]. Children aged 4-14, please call (  [phone]) [phone] to request a pediatric application. ° Dental services are provided in all areas of dental care including fillings, crowns and bridges, complete and partial dentures, implants, gum treatment, root canals, and extractions. Preventive care is also provided. Treatment is provided to both adults and children. °Patients are selected via a lottery and there is often a waiting list. °  °[facility] [address], °[place] ° (336) 763-8833 [email or web address] °  °[facility] [address], [place], [place] (336)723-1848, Ext. 123 Second and Fourth Thursday of each month, opens at 6:30 AM; Clinic ends at 9 AM.  Patients are seen on a first-come first-served basis, and a limited number are seen during each clinic.  ° °[facility] ° [address], [place], [place] (336) 723-7904   Eligibility Requirements °You must have lived in [place], [place], or [place] counties for at least the last three months. °  You cannot be eligible for state or federal sponsored healthcare insurance, including Veterans Administration, Medicaid, or Medicare. °  You generally cannot be eligible for healthcare insurance through your employer.  °  How to apply: °Eligibility screenings are held every Tuesday and Wednesday afternoon from 1:00 pm until 4:00 pm. You do not need an appointment for the interview!  °[facility] [address], [place], [place] 336-631-2330   °[facility]  336-342-8273   °[facility]  336-703-3100   °[facility]  336-570-6415   ° °Behavioral Health Resources in the Community: °Intensive Outpatient Programs °Organization         Address  Phone  Notes  °[facility] [address], [place], [place] 336-878-6098   °[facility] [address], [place], [place] 336-832-9800   °[facility]  [address], [place], [place] ° 336-882-2125   °[facility] [address],  °[place], [place] 1-800-853-5163 or 336-641-4981   °Substance Abuse Resources °Organization         Address  Phone  Notes  °[facility]  336-882-2125   °[facility]  336-784-9470   °[facility]  336-285-9073   °[facility]  336-845-3988   °Residential & Outpatient Substance Abuse Program  1-800-659-3381   °Psychological Services °Organization         Address  Phone  Notes  °[facility]  336- 832-9600   °[facility]  336- 378-7881   °[facility] [address], [place] 1-800-853-5163 or 336-641-4981   ° °Mobile Crisis Teams °Organization         Address  Phone  Notes  °[facility], Mobile Crisis Care Unit  1-877-626-1772   °Assertive °[facility] ° [address] [place], [place] 336-834-9664   °Sharon DeEsch [address] °[place] 336-554-5454   ° °Self-Help/Support Groups °Organization         Address  Phone             Notes  °[facility] [place] [facility] - variety of support groups  336- 373-1402 Call for more information  °Narcotics Anonymous (NA), [facility] [address], °[place]  2 meetings at this location  ° °Residential Treatment Programs °Organization         Address  Phone  Notes  °[facility] [address],    ° [place]  1-866-801-8205   °[facility] °   [address], [place], [place] 704-293-8524   °[facility] [address], [place] 336-845-3988 Admissions: 8am-3pm M-F  °[facility] [address]    °[place], [place] 336-841-1104   °[facility] [address], [place], [place] 336-379-7146   °[facility] [address]  °[place], [place] 336-285-9073   °[facility] Intensive Outpatient [address], [place], [place]  336-852-3033   °[facility] [address]  °[place], [place] 1-877-615-2722 or 336-784-9470   °[facility] [address] [place], [place] 336-227-7417 Accepts Medicaid  °[facility] [address]  ° [place] 1-800-659-3381 Substance Abuse/Addiction Treatment  ° °[place] Behavioral Health Resources °Organization         Address  Phone  Notes  °[facility]  (888) 581-9988   °Julie Brannon, PhD [address] [place], [place]   (336) 349-5553 or (336) 951-0000   °[facility]   [address] °[place], [place] (336) 349-4454   °[facility] [address], [place], [place] (336) 342-8316 Insurance/Medicaid/sponsorship through [facility]  °[facility] [address]                                    [place], [place] (336) 342-8316 Therapy/tele-psych/case  °[facility] [address]  ° [place], [place] (336) 349-2233    °Dr. Arfeen  (336) 349-4544   °[facility] [place]  [employer] [place] [facility] 1) [address], [place] °2) [address], [place] °3)  [address], [place] (336) 349-3220 °(336) 342-7768 ° °(336) 342-8140   °[place] [facility] (336) 342-1394 or (336) 342-3537 (After Hours)    ° ° ° °

## 2014-08-27 NOTE — ED Notes (Signed)
Pt presents with 1 week h/o low back pain,  Denies any injury, reports h/o chronic back pain.  Pt reports pain is different, reports pain is radiating up on R side into R scapula.  Reports pain is constant.

## 2014-08-27 NOTE — ED Notes (Signed)
Pt has hx of "sciatica". No known injury. States it "just started hurting one night".

## 2014-10-28 ENCOUNTER — Emergency Department (HOSPITAL_COMMUNITY)
Admission: EM | Admit: 2014-10-28 | Discharge: 2014-10-28 | Disposition: A | Discharge status: Home / Self Care | Payer: Medicaid Other | Attending: Emergency Medicine | Admitting: Emergency Medicine

## 2014-10-28 ENCOUNTER — Encounter (HOSPITAL_COMMUNITY): Payer: Self-pay | Admitting: Emergency Medicine

## 2014-10-28 DIAGNOSIS — M545 Low back pain: Secondary | ICD-10-CM | POA: Diagnosis present

## 2014-10-28 DIAGNOSIS — M542 Cervicalgia: Secondary | ICD-10-CM | POA: Insufficient documentation

## 2014-10-28 DIAGNOSIS — Z7952 Long term (current) use of systemic steroids: Secondary | ICD-10-CM | POA: Diagnosis not present

## 2014-10-28 DIAGNOSIS — M25511 Pain in right shoulder: Secondary | ICD-10-CM | POA: Insufficient documentation

## 2014-10-28 DIAGNOSIS — Z880 Allergy status to penicillin: Secondary | ICD-10-CM | POA: Diagnosis not present

## 2014-10-28 DIAGNOSIS — M25552 Pain in left hip: Secondary | ICD-10-CM | POA: Insufficient documentation

## 2014-10-28 DIAGNOSIS — Z720 Tobacco use: Secondary | ICD-10-CM | POA: Insufficient documentation

## 2014-10-28 DIAGNOSIS — M25551 Pain in right hip: Secondary | ICD-10-CM | POA: Insufficient documentation

## 2014-10-28 DIAGNOSIS — Z8619 Personal history of other infectious and parasitic diseases: Secondary | ICD-10-CM | POA: Diagnosis not present

## 2014-10-28 DIAGNOSIS — G8929 Other chronic pain: Secondary | ICD-10-CM | POA: Insufficient documentation

## 2014-10-28 DIAGNOSIS — K219 Gastro-esophageal reflux disease without esophagitis: Secondary | ICD-10-CM | POA: Diagnosis not present

## 2014-10-28 DIAGNOSIS — Z7982 Long term (current) use of aspirin: Secondary | ICD-10-CM | POA: Insufficient documentation

## 2014-10-28 DIAGNOSIS — M5441 Lumbago with sciatica, right side: Secondary | ICD-10-CM

## 2014-10-28 DIAGNOSIS — R200 Anesthesia of skin: Secondary | ICD-10-CM | POA: Diagnosis not present

## 2014-10-28 DIAGNOSIS — Z79899 Other long term (current) drug therapy: Secondary | ICD-10-CM | POA: Insufficient documentation

## 2014-10-28 DIAGNOSIS — M5442 Lumbago with sciatica, left side: Secondary | ICD-10-CM

## 2014-10-28 MED ORDER — ACETAMINOPHEN ER 650 MG PO TBCR: 650.0000 mg | EXTENDED_RELEASE_TABLET | Freq: Three times a day (TID) | ORAL | Status: DC | PRN

## 2014-10-28 MED ORDER — PREDNISONE 20 MG PO TABS: 40.0000 mg | ORAL_TABLET | Freq: Every day | ORAL | Status: DC

## 2014-10-28 MED ORDER — CYCLOBENZAPRINE HCL 10 MG PO TABS: 10.0000 mg | ORAL_TABLET | Freq: Two times a day (BID) | ORAL | Status: DC | PRN

## 2014-10-28 NOTE — ED Notes (Signed)
Hx of chronic back pain. States started having increased pain 1 week ago, pain from neck down right arm with intermittent numbness, LBP radiating to both legs.

## 2014-10-28 NOTE — ED Notes (Signed)
Declined W/C at D/C and was escorted to lobby by RN. 

## 2014-10-28 NOTE — Discharge Instructions (Signed)
Take tylenol as needed for pain. Take Flexeril as needed for muscle spasm. Take prednisone as directed until gone. Refer to attached documents for more information.

## 2014-10-28 NOTE — ED Provider Notes (Signed)
CSN: [id number]     Arrival date & time 10/28/14  [id number] History  This chart was scribed for non-physician practitioner, Emilia BeckKaitlyn Suella Cogar, PA-C, working with Mancel BaleElliott Wentz, MD by Charline BillsEssence Howell, ED Scribe. This patient was seen in room TR10C/TR10C and the patient's care was started at 9:29 AM.   Chief Complaint  Patient presents with  . Back Pain  . Neck Pain   The history is provided by the patient. No language interpreter was used.   HPI Comments: Cheryl Fox is a 34 y.o. female, with a h/o Hepatitis C, who presents to the Emergency Department complaining of gradually worsening lower back pain that radiates into lower extremities for the past week. Pt reports h/o chronic back pain but reports associated numbness in her feet and bilateral hip pain for the past week . She also reports sharp neck pain that is exacerbated with movement. Pt further reports rotator cuff tear 1 year ago; she did not have surgery or PT due to lack of insurance. She reports associated intermittent numbness in her R arm and hand x3-4 daily for the past 4 days. No medications tried PTA. Pt does not have an orthopedist.   Past Medical History  Diagnosis Date  . Hepatitis C   . Acid reflux    Past Surgical History  Procedure Laterality Date  . Cholecystectomy     No family history on file. History  Substance Use Topics  . Smoking status: Current Every Day Smoker -- 0.50 packs/day    Types: Cigarettes  . Smokeless tobacco: Not on file  . Alcohol Use: No     Comment: occasion   OB History    No data available     Review of Systems  Musculoskeletal: Positive for back pain, arthralgias and neck pain.  Neurological: Positive for numbness.  All other systems reviewed and are negative.  Allergies  Celecoxib; Motrin; Penicillins; Tramadol; and Naproxen  Home Medications   Prior to Admission medications   Medication Sig Start Date End Date Taking? Authorizing Provider  acetaminophen (TYLENOL) 160 MG/5ML  elixir Take 15.6 mLs (500 mg total) by mouth every 6 (six) hours as needed for fever or pain. 04/24/14   Mercedes Camprubi-Soms, PA-C  aspirin-acetaminophen-caffeine (EXCEDRIN MIGRAINE) [phone] MG per tablet Take 1 tablet by mouth every 8 (eight) hours as needed for migraine.    Historical Provider, MD  cyclobenzaprine (FLEXERIL) 10 MG tablet Take 1 tablet (10 mg total) by mouth 2 (two) times daily as needed for muscle spasms. 06/15/14   Courtney Forcucci, PA-C  cyclobenzaprine (FLEXERIL) 10 MG tablet Take 1 tablet (10 mg total) by mouth 2 (two) times daily as needed for muscle spasms. 08/27/14   Ladona MowJoe Mintz, PA-C  HYDROcodone-acetaminophen (NORCO/VICODIN) 5-325 MG per tablet Take 1 tablet by mouth every 6 (six) hours as needed for moderate pain or severe pain. 06/15/14   Courtney Forcucci, PA-C  HYDROcodone-acetaminophen (NORCO/VICODIN) 5-325 MG per tablet Take 1-2 tablets by mouth every 4 (four) hours as needed for moderate pain or severe pain. 08/27/14   Ladona MowJoe Mintz, PA-C  omeprazole (PRILOSEC) 20 MG capsule Take 20 mg by mouth 2 (two) times daily.    Historical Provider, MD  omeprazole (PRILOSEC) 20 MG capsule Take 1 capsule (20 mg total) by mouth daily. 04/24/14   Mercedes Camprubi-Soms, PA-C  predniSONE (DELTASONE) 10 MG tablet Day 1 take 6 pills Day 2 take 5 pills Day 3 take 4 pills Day 4 take 3 pills Day 5 take 2 pills Day 6  take 1 pill 06/15/14   Courtney Forcucci, PA-C  predniSONE (DELTASONE) 20 MG tablet Take 2 tablets (40 mg total) by mouth daily. 08/27/14   Ladona Mow, PA-C   BP 145/82 mmHg  Pulse 73  Temp(Src) 97.7 F (36.5 C) (Oral)  Resp 18  SpO2 100% Physical Exam  Constitutional: She is oriented to person, place, and time. She appears well-developed and well-nourished. No distress.  HENT:  Head: Normocephalic and atraumatic.  Eyes: Conjunctivae and EOM are normal.  Neck: Neck supple. No tracheal deviation present.  Cardiovascular: Normal rate.   Pulmonary/Chest: Effort normal.  No respiratory distress.  Abdominal: Soft. She exhibits no distension. There is no tenderness.  Musculoskeletal: Normal range of motion.  No midline spine tenderness to palpation. Bilateral lumbar paraspinal tenderness to palpation. R trapezius tenderness to palpation. Full ROM of R shoulder. Generalized tenderness to palpation to R shoulder.  Neurological: She is alert and oriented to person, place, and time. Coordination normal.  Sensation and strength intact.   Skin: Skin is warm and dry.  Psychiatric: She has a normal mood and affect. Her behavior is normal.  Nursing note and vitals reviewed.  ED Course  Procedures (including critical care time) DIAGNOSTIC STUDIES: Oxygen Saturation is 100% on RA, normal by my interpretation.    COORDINATION OF CARE: 9:32 AM-Discussed treatment plan which includes Tylenol, Flexeril and Prednisone with pt at bedside and pt agreed to plan.   Labs Review Labs Reviewed - No data to display  Imaging Review No results found.   EKG Interpretation None      MDM   Final diagnoses:  Bilateral low back pain with sciatica, sciatica laterality unspecified  Right shoulder pain    Patient has chronic pain and will be treated with flexeril, tylenol and prednisone for sciatica. Patient will not have narcotic pain medication due to frequent ED visits for the same complaint. Patient advised to follow up with Ortho. No acute injury.   I personally performed the services described in this documentation, which was scribed in my presence. The recorded information has been reviewed and is accurate.    Emilia Beck, PA-C 10/28/14 1042  Mancel Bale, MD 10/28/14 [id number]

## 2015-03-17 ENCOUNTER — Other Ambulatory Visit: Payer: Self-pay | Admitting: Cardiovascular Disease

## 2015-03-17 ENCOUNTER — Ambulatory Visit
Admission: RE | Admit: 2015-03-17 | Discharge: 2015-03-17 | Disposition: A | Discharge status: Home / Self Care | Payer: Medicaid Other | Source: Ambulatory Visit | Attending: Cardiovascular Disease | Admitting: Cardiovascular Disease

## 2015-03-17 DIAGNOSIS — R200 Anesthesia of skin: Secondary | ICD-10-CM

## 2015-05-08 IMAGING — CR DG CHEST 2V
2 series · 2 of 2 positions shown · non-contrast
Comparison: 08/29/2012

CLINICAL DATA: Chest pain

EXAM:
CHEST  2 VIEW

[w chest lat]
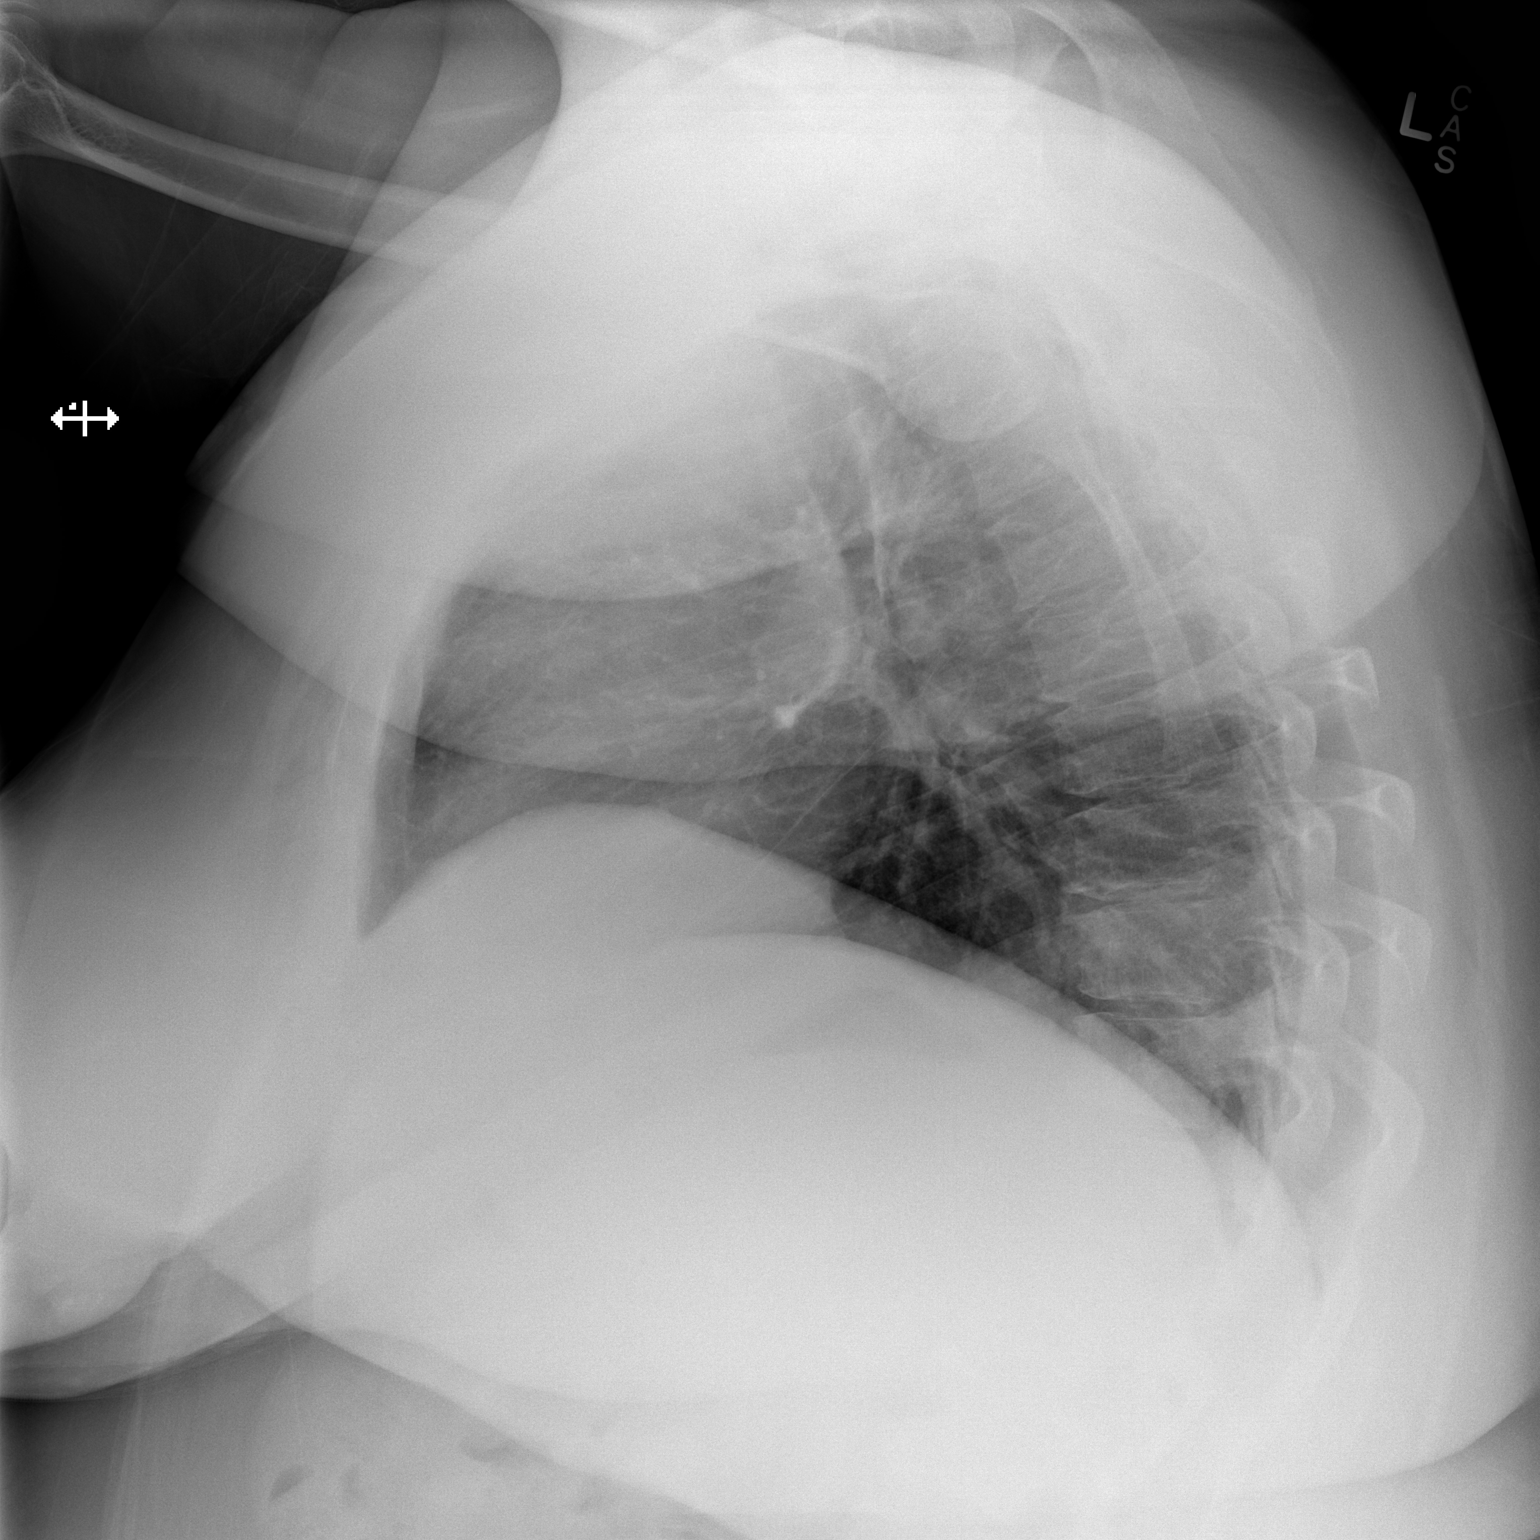

[w chest pa]
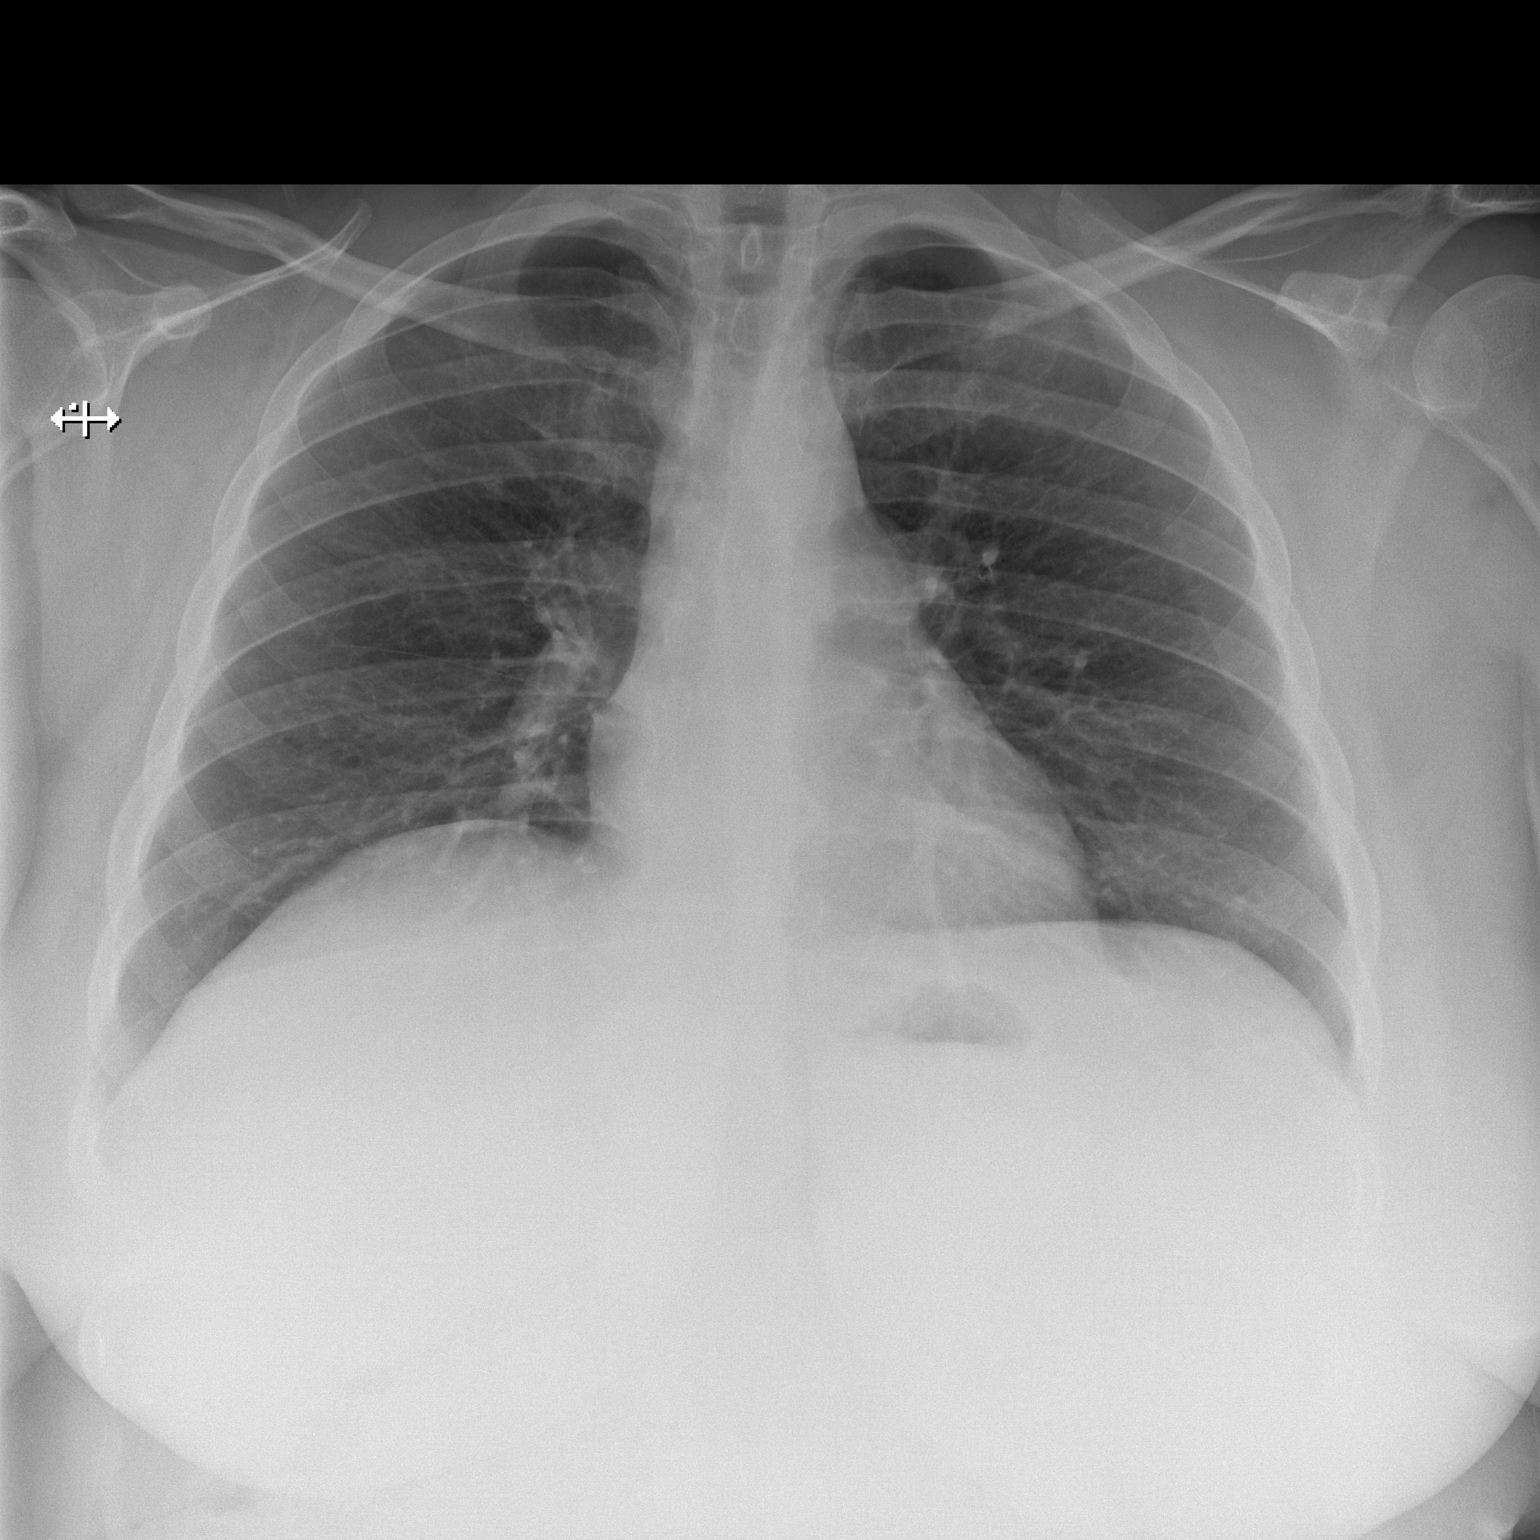

[2 of 2 positions shown; findings below may reference images not displayed]

FINDINGS: The heart size and mediastinal contours are within normal limits.
Both lungs are clear. The visualized skeletal structures are
unremarkable.
IMPRESSION: No active cardiopulmonary disease.

## 2017-02-12 ENCOUNTER — Emergency Department (HOSPITAL_COMMUNITY)
Admission: EM | Admit: 2017-02-12 | Discharge: 2017-02-12 | Discharge status: Against Medical Advice | Payer: Medicaid Other | Attending: Emergency Medicine | Admitting: Emergency Medicine

## 2017-02-12 DIAGNOSIS — R51 Headache: Secondary | ICD-10-CM | POA: Diagnosis present

## 2017-02-12 DIAGNOSIS — Z5321 Procedure and treatment not carried out due to patient leaving prior to being seen by health care provider: Secondary | ICD-10-CM | POA: Diagnosis not present

## 2017-02-12 NOTE — ED Notes (Signed)
Pt called for triage x2 

## 2017-02-12 NOTE — ED Notes (Signed)
Pt called for triage multiple times without answering.

## 2017-02-19 ENCOUNTER — Emergency Department (HOSPITAL_COMMUNITY): Payer: Medicaid Other

## 2017-02-19 ENCOUNTER — Encounter (HOSPITAL_COMMUNITY): Payer: Self-pay | Admitting: Emergency Medicine

## 2017-02-19 ENCOUNTER — Emergency Department (HOSPITAL_COMMUNITY)
Admission: EM | Admit: 2017-02-19 | Discharge: 2017-02-19 | Disposition: A | Discharge status: Home / Self Care | Payer: Medicaid Other | Attending: Emergency Medicine | Admitting: Emergency Medicine

## 2017-02-19 DIAGNOSIS — S2232XD Fracture of one rib, left side, subsequent encounter for fracture with routine healing: Secondary | ICD-10-CM | POA: Insufficient documentation

## 2017-02-19 DIAGNOSIS — F17210 Nicotine dependence, cigarettes, uncomplicated: Secondary | ICD-10-CM | POA: Insufficient documentation

## 2017-02-19 DIAGNOSIS — G8911 Acute pain due to trauma: Secondary | ICD-10-CM | POA: Diagnosis not present

## 2017-02-19 DIAGNOSIS — Z79899 Other long term (current) drug therapy: Secondary | ICD-10-CM | POA: Insufficient documentation

## 2017-02-19 DIAGNOSIS — R1032 Left lower quadrant pain: Secondary | ICD-10-CM | POA: Diagnosis present

## 2017-02-19 DIAGNOSIS — S2232XA Fracture of one rib, left side, initial encounter for closed fracture: Secondary | ICD-10-CM

## 2017-02-19 MED ORDER — OXYCODONE-ACETAMINOPHEN 5-325 MG PO TABS
1.0000 | ORAL_TABLET | Freq: Once | ORAL | Status: AC
  Administered 2017-02-19: 1 via ORAL
  Filled 2017-02-19: qty 1
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
## 2017-02-19 NOTE — ED Triage Notes (Signed)
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 Unsure if it is muscular but it hurts more on L flank to move. Alert and oriented.
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
## 2017-07-19 ENCOUNTER — Ambulatory Visit (HOSPITAL_COMMUNITY)
Admission: EM | Admit: 2017-07-19 | Discharge: 2017-07-19 | Disposition: A | Discharge status: Home / Self Care | Payer: Medicaid Other | Attending: Family Medicine | Admitting: Family Medicine

## 2017-07-19 ENCOUNTER — Other Ambulatory Visit: Payer: Self-pay

## 2017-07-19 ENCOUNTER — Ambulatory Visit (INDEPENDENT_AMBULATORY_CARE_PROVIDER_SITE_OTHER): Payer: Medicaid Other

## 2017-07-19 ENCOUNTER — Encounter (HOSPITAL_COMMUNITY): Payer: Self-pay | Admitting: Emergency Medicine

## 2017-07-19 DIAGNOSIS — M25561 Pain in right knee: Secondary | ICD-10-CM | POA: Diagnosis not present

## 2017-07-19 HISTORY — DX: Essential (primary) hypertension: I10

## 2017-07-19 MED ORDER — HYDROCODONE-ACETAMINOPHEN 5-325 MG PO TABS: 1.0000 | ORAL_TABLET | Freq: Every evening | ORAL | 0 refills | Status: DC | PRN

## 2017-07-19 NOTE — Discharge Instructions (Signed)
Be aware, pain medications may cause drowsiness. Please do not drive, operate heavy machinery or make important decisions while on this medication, it can cloud your judgement.  
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
## 2017-07-19 NOTE — ED Provider Notes (Signed)
[facility]   [id number] 07/19/17 Arrival Time: 1214  ASSESSMENT & PLAN:  1. Right knee pain, unspecified chronicity    Imaging: Dg Knee Complete 4 Views Right  Result Date: 07/19/2017 CLINICAL DATA:  Persistent right knee pain after slipping on ice 3 weeks ago. EXAM: RIGHT KNEE - COMPLETE 4+ VIEW COMPARISON:  Right knee x-rays dated August 29, 2012. FINDINGS: No acute fracture or malalignment. Tricompartmental joint space narrowing with large osteophytes, moderate in the patellofemoral compartment, progressed when compared to prior study. No joint effusion. Bone mineralization is normal. Soft tissues are unremarkable. IMPRESSION: 1. No acute osseous abnormality. 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 Electronically Signed   By: Obie DredgeWilliam T Derry M.D.   On: 07/19/2017 13:28   Knee immobilizer placed. Crutches with trials of WBAT. Keep appt with orthopaedist next week.  Meds ordered this encounter  Medications  . HYDROcodone-acetaminophen (NORCO/VICODIN) 5-325 MG tablet    Sig: Take 1 tablet by mouth at bedtime as needed for moderate pain or severe pain.    Dispense:  10 tablet    Refill:  0   Medication sedation precautions. Recommend orthopaedic follow up. She will schedule.  Reviewed expectations re: course of current medical issues. Questions answered. Outlined signs and symptoms indicating need for more acute intervention. Patient verbalized understanding. After Visit Summary given.  SUBJECTIVE: History from: patient. Cheryl Fox is a 37 y.o. female who reports persistent pain of her R knee after falling and twisting knee 3 weeks ago. Ambulatory with knee pain reported. OTC Aleve/ibuprofen/Tylenol without relief. Affecting sleep now. Pain described as a dull ache with sharp exacerbations with certain movements. Questions minimal swelling. No extremity sensation changes or weakness. No h/o R  knee injury. No locking or giving out of knee.  ROS: As per HPI.   OBJECTIVE:  Vitals:   07/19/17 1300  BP: 122/80  Pulse: 86  Resp: 14  Temp: 98 F (36.7 C)  SpO2: 100%    General appearance: alert; no distress Extremities: no cyanosis or edema; symmetrical with no gross deformities; generalized tenderness over her right knee with no swelling and no bruising; ROM: normal CV: normal extremity capillary refill Skin: warm and dry Neurologic: normal gait; normal symmetric reflexes in all extremities; normal sensation in all extremities Psychological: alert and cooperative; normal mood and affect   Allergies  Allergen Reactions  . Prednisone Hives, Nausea Only and Other (See Comments)    Makes her angry   . Vicoprofen [Hydrocodone-Ibuprofen] Nausea And Vomiting  . Motrin [Ibuprofen] Other (See Comments)    Stomach irritation  . Tramadol Nausea And Vomiting and Other (See Comments)    Very bad headache   . Naproxen Other (See Comments)    Very bad headache     Past Medical History:  Diagnosis Date  . Acid reflux   . Hepatitis C   . Hypertension    Social History   Socioeconomic History  . Marital status: Single    Spouse name: Not on file  . Number of children: Not on file  . Years of education: Not on file  . Highest education level: Not on file  Social Needs  . Financial resource strain: Not on file  . Food insecurity - worry: Not on file  . Food insecurity - inability: Not on file  . Transportation needs - medical: Not on file  . Transportation needs - non-medical: Not on file  Occupational History  . Not on file  Tobacco Use  . Smoking status: Current Every Day Smoker    Packs/day: 0.50    Types: Cigarettes  Substance and Sexual Activity  . Alcohol use: No    Comment: occasion  . Drug use: No  . Sexual activity: Not on file  Other Topics Concern  . Not on file  Social History Narrative  . Not on file   No family history on file. Past Surgical  History:  Procedure Laterality Date  . ABDOMINAL HYSTERECTOMY    . Barrington Ellison, MD 07/25/17 [phone]

## 2017-07-23 ENCOUNTER — Encounter (INDEPENDENT_AMBULATORY_CARE_PROVIDER_SITE_OTHER): Payer: Self-pay | Admitting: Orthopedic Surgery

## 2017-07-23 ENCOUNTER — Ambulatory Visit (INDEPENDENT_AMBULATORY_CARE_PROVIDER_SITE_OTHER): Payer: Medicaid Other | Admitting: Orthopedic Surgery

## 2017-07-23 DIAGNOSIS — M25571 Pain in right ankle and joints of right foot: Secondary | ICD-10-CM | POA: Diagnosis not present

## 2017-07-23 DIAGNOSIS — M1711 Unilateral primary osteoarthritis, right knee: Secondary | ICD-10-CM

## 2017-07-23 MED ORDER — HYDROCODONE-ACETAMINOPHEN 5-325 MG PO TABS: 1.0000 | ORAL_TABLET | Freq: Four times a day (QID) | ORAL | 0 refills | Status: DC | PRN
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
## 2017-07-27 ENCOUNTER — Telehealth (INDEPENDENT_AMBULATORY_CARE_PROVIDER_SITE_OTHER): Payer: Self-pay

## 2017-07-27 MED ORDER — NABUMETONE 750 MG PO TABS: 750.0000 mg | ORAL_TABLET | Freq: Two times a day (BID) | ORAL | 0 refills | Status: AC | PRN

## 2017-07-27 NOTE — Addendum Note (Signed)
Addended by: Donalee CitrinPEELE, STEPHENEY L on: 07/27/2017 01:49 PM   Modules accepted: Orders

## 2017-07-27 NOTE — Telephone Encounter (Signed)
I called and spoke with patient, advised unfortunately narcotics are not appropriate treatment for arthritis pain, eventually she will need a knee replacement. If she is given narcotics at this time, what is given to her post operatively will not help her with her pain, she will only require more narcotics which is dangerous for respiratory health. Advised we could send in relafen 750mg  1 po bid. [facility] [address]

## 2017-07-27 NOTE — Telephone Encounter (Signed)
Patient would like a Rx refill on Hydrocodone.  Cb# is [phone].  Please advise.  Thank you.

## 2017-07-27 NOTE — Telephone Encounter (Signed)
Call in prescription for Relafen 750 mg p.o. twice daily.  This is going to be a long-term process and chronic narcotics for her knee arthritis is not appropriate.  Make sure she is not taking any other nonsteroidals.

## 2017-07-30 ENCOUNTER — Telehealth (INDEPENDENT_AMBULATORY_CARE_PROVIDER_SITE_OTHER): Payer: Self-pay | Admitting: Orthopedic Surgery

## 2017-07-30 ENCOUNTER — Encounter (HOSPITAL_COMMUNITY): Payer: Self-pay | Admitting: Emergency Medicine

## 2017-07-30 ENCOUNTER — Other Ambulatory Visit: Payer: Self-pay

## 2017-07-30 ENCOUNTER — Emergency Department (HOSPITAL_COMMUNITY)
Admission: EM | Admit: 2017-07-30 | Discharge: 2017-07-30 | Disposition: A | Discharge status: Home / Self Care | Payer: Medicaid Other | Attending: Emergency Medicine | Admitting: Emergency Medicine

## 2017-07-30 DIAGNOSIS — F17210 Nicotine dependence, cigarettes, uncomplicated: Secondary | ICD-10-CM | POA: Diagnosis not present

## 2017-07-30 DIAGNOSIS — Z79899 Other long term (current) drug therapy: Secondary | ICD-10-CM | POA: Diagnosis not present

## 2017-07-30 DIAGNOSIS — M25561 Pain in right knee: Secondary | ICD-10-CM | POA: Diagnosis not present

## 2017-07-30 DIAGNOSIS — Z760 Encounter for issue of repeat prescription: Secondary | ICD-10-CM | POA: Diagnosis present

## 2017-07-30 DIAGNOSIS — I10 Essential (primary) hypertension: Secondary | ICD-10-CM | POA: Insufficient documentation

## 2017-07-30 DIAGNOSIS — G8929 Other chronic pain: Secondary | ICD-10-CM | POA: Diagnosis not present

## 2017-07-30 MED ORDER — HYDROCODONE-ACETAMINOPHEN 5-325 MG PO TABS
1.0000 | ORAL_TABLET | Freq: Once | ORAL | Status: AC
  Administered 2017-07-30: 1 via ORAL
  Filled 2017-07-30: qty 1

## 2017-07-30 NOTE — Telephone Encounter (Signed)
Patient called wanting a RX refilled Hydrocodone 325.  Please call patient to advise

## 2017-07-30 NOTE — Discharge Instructions (Signed)
Please continue to try and get an appointment with Dr. Lajoyce Cornersuda

## 2017-07-30 NOTE — ED Notes (Signed)
Pt states she is here because she ran out of medication from her ortho and needs refill until seen by hime. Pt stes she is to have knee replacement for chronioc issue

## 2017-07-30 NOTE — ED Notes (Signed)
Pt home stable with steady gait after pt states she understands instructions.

## 2017-07-30 NOTE — Telephone Encounter (Signed)
Again narcotics are not appropriate treatment for arthritis pain, eventually she will need a knee replacement. If she is given narcotics at this time, what is given to her post operatively will not help her with her pain, she will only require more narcotics which is dangerous for respiratory health. Advised we could send in relafen 750mg  1 po bid. [facility] [address]. Patient is in emergency room now.

## 2017-07-30 NOTE — ED Provider Notes (Signed)
[facility] Provider Note   CSN: [id number] Arrival date & time: 07/30/17  1017   History   Chief Complaint Chief Complaint  Patient presents with  . Medication Refill   HPI Cheryl Fox is a 37 y.o. female who presents with right knee pain.  Past medical history significant for hep C, hypertension, chronic pain. She states that she had a fall several weeks ago after the snow and went to urgent care.  She had an x-ray of her knee which showed tricompartmental arthritis.  She was discharged with narcotics and advised to follow-up with orthopedics.  She followed up and saw Dr. Lajoyce Cornersuda to who recommended a knee replacement.  She states she will not be able to do this anytime soon due to financial reasons. She has had multiple knee injections in the past which have not provided any relief.  He was gave her a prescription for narcotics and she has finished these.  She is requesting a refill of this medicine.  She denies any additional injuries.  The pain radiates to the hip and to the ankle.  HPI  Past Medical History:  Diagnosis Date  . Acid reflux   . Hepatitis C   . Hypertension     There are no active problems to display for this patient.   Past Surgical History:  Procedure Laterality Date  . ABDOMINAL HYSTERECTOMY    . CHOLECYSTECTOMY      OB History    No data available       Home Medications    Prior to Admission medications   Medication Sig Start Date End Date Taking? Authorizing Provider  amLODipine (NORVASC) 5 MG tablet Take 5 mg by mouth daily.    [provider]  HYDROcodone-acetaminophen (NORCO/VICODIN) 5-325 MG tablet Take 1 tablet by mouth at bedtime as needed for moderate pain or severe pain. 07/19/17   Mardella LaymanHagler, Brian, MD  HYDROcodone-acetaminophen (NORCO/VICODIN) 5-325 MG tablet Take 1 tablet by mouth every 6 (six) hours as needed for moderate pain. 07/23/17   Nadara Mustarduda, Marcus V, MD  nabumetone (RELAFEN) 750 MG tablet  Take 1 tablet (750 mg total) by mouth 2 (two) times daily as needed for mild pain or moderate pain. with food 07/27/17 08/26/17  Nadara Mustarduda, Marcus V, MD  omeprazole (PRILOSEC) 20 MG capsule Take 20 mg by mouth daily.     [provider]  oxyCODONE (OXY IR/ROXICODONE) 5 MG immediate release tablet Take 5 mg by mouth every 6 (six) hours as needed for severe pain.    [provider]    Family History No family history on file.  Social History Social History   Tobacco Use  . Smoking status: Current Every Day Smoker    Packs/day: 0.50    Types: Cigarettes  Substance Use Topics  . Alcohol use: No    Comment: occasion  . Drug use: No     Allergies   Prednisone; Vicoprofen [hydrocodone-ibuprofen]; Motrin [ibuprofen]; Tramadol; and Naproxen   Review of Systems Review of Systems  Musculoskeletal: Positive for arthralgias.  Skin: Negative for color change, pallor and wound.     Physical Exam Updated Vital Signs BP (!) 142/101   Pulse 67   Temp 98.3 F (36.8 C)   Resp 18   LMP 08/10/2014 (Approximate)   SpO2 100%   Physical Exam  Constitutional: She is oriented to person, place, and time. She appears well-developed and well-nourished. No distress.  HENT:  Head: Normocephalic and atraumatic.  Eyes:  Conjunctivae are normal. Pupils are equal, round, and reactive to light. Right eye exhibits no discharge. Left eye exhibits no discharge. No scleral icterus.  Neck: Normal range of motion.  Cardiovascular: Normal rate.  Pulmonary/Chest: Effort normal. No respiratory distress.  Abdominal: She exhibits no distension.  Musculoskeletal:  Right knee: No obvious swelling, deformity, or warmth. No significant tenderness. FROM. 5/5 strength. N/V intact.   Neurological: She is alert and oriented to person, place, and time.  Skin: Skin is warm and dry.  Psychiatric: She has a normal mood and affect. Her behavior is normal.  Nursing note and vitals reviewed.    ED  Treatments / Results  Labs (all labs ordered are listed, but only abnormal results are displayed) Labs Reviewed - No data to display  EKG  EKG Interpretation None       Radiology No results found.  Procedures Procedures (including critical care time)  Medications Ordered in ED Medications  HYDROcodone-acetaminophen (NORCO/VICODIN) 5-325 MG per tablet 1 tablet (not administered)     Initial Impression / Assessment and Plan / ED Course  I have reviewed the triage vital signs and the nursing notes.  Pertinent labs & imaging results that were available during my care of the patient were reviewed by me and considered in my medical decision making (see chart for details).  37 year old female with chronic right knee pain.  She is requesting refill of narcotic medication.  I advised her that I would not be able to do this today and she will have to follow-up with Dr. Lajoyce Corners to for refills.  She was given 1 dose of pain medicine in the ED. Narcotic database was queried and revealed that the patient did receive a 5-day supply of Norco on January 15.  Return precautions given for acute pain.  Final Clinical Impressions(s) / ED Diagnoses   Final diagnoses:  Chronic pain of right knee    ED Discharge Orders    None       Bethel Born, PA-C 07/30/17 1249    Tilden Fossa, MD 07/30/17 2042

## 2017-07-30 NOTE — ED Triage Notes (Signed)
Pt states saw orthopedic, he scheduled her a knee replacement for 8 weeks from now and gave her only 1 week supply of pain meds, out of those. Would like a prescription to last her until she can get a longer term script from him.

## 2017-08-01 NOTE — Telephone Encounter (Signed)
Called pt no answer. Will hold and try again.

## 2017-08-01 NOTE — Telephone Encounter (Signed)
Pt called to decline surgery at the moment. Pt would like med refill Hydrocodone.

## 2017-08-02 ENCOUNTER — Telehealth (INDEPENDENT_AMBULATORY_CARE_PROVIDER_SITE_OTHER): Payer: Self-pay | Admitting: Orthopedic Surgery

## 2017-08-02 NOTE — Telephone Encounter (Signed)
Message sent in error

## 2017-08-02 NOTE — Telephone Encounter (Signed)
Pt has an appt for tomorrow.  ?

## 2017-08-02 NOTE — Telephone Encounter (Signed)
Patient came into the office stating that she is allergic to the anti-inflammatory that was called in for her.  She also stated that she wanted to be taken off the surgery schedule because she has to take care of her mom and wanted to know what she needs to do for her pain.  CB#[phone].  Thank you.

## 2017-08-02 NOTE — Telephone Encounter (Signed)
Pt has an appt for tomorrow will discuss at that time

## 2017-08-02 NOTE — Telephone Encounter (Signed)
Patient called asked for a call back concerning Rx. Patient gave another number to contact her. The ph# is [phone]

## 2017-08-03 ENCOUNTER — Ambulatory Visit (INDEPENDENT_AMBULATORY_CARE_PROVIDER_SITE_OTHER): Payer: Medicaid Other | Admitting: Family

## 2017-08-03 ENCOUNTER — Encounter (INDEPENDENT_AMBULATORY_CARE_PROVIDER_SITE_OTHER): Payer: Self-pay | Admitting: Family

## 2017-08-03 DIAGNOSIS — M1711 Unilateral primary osteoarthritis, right knee: Secondary | ICD-10-CM | POA: Diagnosis not present

## 2017-08-03 MED ORDER — HYDROCODONE-ACETAMINOPHEN 5-325 MG PO TABS: 1.0000 | ORAL_TABLET | Freq: Four times a day (QID) | ORAL | 0 refills | Status: DC | PRN

## 2017-08-08 ENCOUNTER — Encounter (INDEPENDENT_AMBULATORY_CARE_PROVIDER_SITE_OTHER): Payer: Self-pay | Admitting: Family

## 2017-08-08 NOTE — Progress Notes (Signed)
Office Visit Note   Patient: Cheryl Fox           Date of Birth: [birth year]           MRN: [id number] Visit Date: 08/03/2017              [facility], [facility] [address] [place], [place] [address] PCP: Center, [facility]  Chief Complaint  Patient presents with  . Right Knee - Pain      HPI: Patient is a 37 year old woman who presents with chronic right knee pain. Is concerned today about setting up there TKA. She does not know how to get her BMI down. Has tried diets in past with no relief. States cannot exercise due to knee pain. Has started on Relafen which Dr. Lajoyce Corners ordered last week, no relief with this. Very concerned about getting narcotic pain medications filled until can have TKA.   She states she has pain with weightbearing pain with activities of daily living.  She states that when she was 37 years old she dislocated her patella she was casted for 6 months and she has had problems ever since.  Patient states that she slipped 3-4 weeks ago on the snow, radiographs were obtained these were reviewed and she does have tricompartmental arthritis of the right knee worse in the patellofemoral joint.  Patient complains of pain globally around the knee pain with standing also complains of instability with her right ankle.  Past medical history is updated review of systems she has smoked for 22 years she smokes about half a pack a day review of systems positive for acid reflux arthritis bladder problems hypertension and migraines.  Assessment & Plan: Visit Diagnoses:  1. Primary osteoarthritis of right knee     Plan: Recommended weight loss that we would have to get her BMI under 40 to proceed with surgery. recommended smoking cessation. She is given a last prescription for Vicodin for breakthrough pain. Have referred to nutrition, hope this will aid in her weight loss. Discussed bariatric surgery, will hold for now. Will also refer to pain management.  Discussed with patient and patient understands that we will not be able to provide narcotic prescriptions long term. Understands the Vicodin will not be refilled. follow-up in 2 months to check on her progress.  Follow-Up Instructions: Return if symptoms worsen or fail to improve.   Ortho Exam  Patient is alert, oriented, no adenopathy, well-dressed, normal affect, normal respiratory effort. Examination patient has an antalgic gait. she has lateral tracking of the patella with crepitation of the patellofemoral joint with range of motion. the patella does not dislocate. she is tender to palpation over the medial and lateral joint lines collaterals and cruciates are stable.  She does have instability with her right ankle.  Imaging: No results found. No images are attached to the encounter.  Labs: Lab Results  Component Value Date   REPTSTATUS 09/27/2012 FINAL 09/25/2012   CULT  09/25/2012    Multiple bacterial morphotypes present, none predominant. Suggest appropriate recollection if clinically indicated.    @LABSALLVALUES (HGBA1)@  There is no height or weight on file to calculate BMI.  Orders:  No orders of the defined types were placed in this encounter.  No orders of the defined types were placed in this encounter.    Procedures: No procedures performed  Clinical Data: No additional findings.  ROS:  All other systems negative, except as noted in the HPI. Review of Systems  Constitutional: Negative  for chills and fever.  Musculoskeletal: Positive for arthralgias, gait problem and joint swelling.    Objective: Vital Signs: LMP 08/10/2014 (Approximate)   Specialty Comments:  No specialty comments available.  PMFS History: There are no active problems to display for this patient.  Past Medical History:  Diagnosis Date  . Acid reflux   . Hepatitis C   . Hypertension     History reviewed. No pertinent family history.  Past Surgical History:  Procedure  Laterality Date  . ABDOMINAL HYSTERECTOMY    . CHOLECYSTECTOMY     Social History   Occupational History  . Not on file  Tobacco Use  . Smoking status: Current Every Day Smoker    Packs/day: 0.50    Types: Cigarettes  . Smokeless tobacco: Never Used  Substance and Sexual Activity  . Alcohol use: No    Comment: occasion  . Drug use: No  . Sexual activity: Not on file

## 2017-08-15 ENCOUNTER — Ambulatory Visit (INDEPENDENT_AMBULATORY_CARE_PROVIDER_SITE_OTHER): Payer: Medicaid Other | Admitting: Orthopedic Surgery

## 2017-08-27 ENCOUNTER — Ambulatory Visit (INDEPENDENT_AMBULATORY_CARE_PROVIDER_SITE_OTHER): Payer: Medicaid Other | Admitting: Orthopedic Surgery

## 2017-09-10 ENCOUNTER — Ambulatory Visit: Payer: Medicaid Other | Attending: Physical Therapy | Admitting: Physical Therapy

## 2017-09-20 ENCOUNTER — Ambulatory Visit (INDEPENDENT_AMBULATORY_CARE_PROVIDER_SITE_OTHER): Payer: Medicaid Other | Admitting: Orthopedic Surgery

## 2017-11-08 ENCOUNTER — Encounter (INDEPENDENT_AMBULATORY_CARE_PROVIDER_SITE_OTHER): Payer: Self-pay

## 2017-11-15 ENCOUNTER — Ambulatory Visit (INDEPENDENT_AMBULATORY_CARE_PROVIDER_SITE_OTHER): Payer: Self-pay | Admitting: Family Medicine

## 2017-11-29 ENCOUNTER — Ambulatory Visit (INDEPENDENT_AMBULATORY_CARE_PROVIDER_SITE_OTHER): Payer: Medicaid Other | Admitting: Orthopaedic Surgery

## 2017-12-06 ENCOUNTER — Ambulatory Visit (INDEPENDENT_AMBULATORY_CARE_PROVIDER_SITE_OTHER): Payer: Medicaid Other | Admitting: Orthopaedic Surgery

## 2017-12-13 ENCOUNTER — Ambulatory Visit (INDEPENDENT_AMBULATORY_CARE_PROVIDER_SITE_OTHER): Payer: Medicaid Other | Admitting: Orthopaedic Surgery

## 2017-12-20 ENCOUNTER — Ambulatory Visit (INDEPENDENT_AMBULATORY_CARE_PROVIDER_SITE_OTHER): Payer: Medicaid Other | Admitting: Orthopaedic Surgery

## 2017-12-20 ENCOUNTER — Encounter (INDEPENDENT_AMBULATORY_CARE_PROVIDER_SITE_OTHER): Payer: Self-pay | Admitting: Orthopaedic Surgery

## 2017-12-20 DIAGNOSIS — M79641 Pain in right hand: Secondary | ICD-10-CM | POA: Diagnosis not present

## 2017-12-20 DIAGNOSIS — M79642 Pain in left hand: Secondary | ICD-10-CM | POA: Diagnosis not present

## 2017-12-20 DIAGNOSIS — Z6841 Body Mass Index (BMI) 40.0 and over, adult: Secondary | ICD-10-CM | POA: Diagnosis not present

## 2017-12-20 NOTE — Addendum Note (Signed)
Addended by: Albertina ParrGARCIA, Xsavier Seeley on: 12/20/2017 11:27 AM   Modules accepted: Orders

## 2017-12-20 NOTE — Progress Notes (Signed)
Office Visit Note   Patient: Cheryl Fox           Date of Birth: 02/17/1981           MRN: [id number] Visit Date: 12/20/2017              [facility]: [facility], [facility] [address] [place], [place] [id number] PCP: [facility], [facility]   Assessment & Plan: Visit Diagnoses:  1. Morbid obesity (HCC)   2. Body mass index 40.0-44.9, adult (HCC)   3. 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  At this point, we will obtain a nerve conduction study to further assess this.  She will follow-up with us once that has been completed.  Call with concerns or questions in the meantime.  Follow-Up Instructions: Return in about 2 weeks (around 01/03/2018).   Orders:  No orders of the defined types were placed in this encounter.  No orders of the defined types were placed in this encounter.     Procedures: No procedures performed   Clinical Data: No additional findings.   Subjective: Chief Complaint  Patient presents with  . Right Wrist - Pain, Numbness, Tingling  . Left Wrist - Pain, Numbness, Tingling    HPI patient is a pleasant 37 year old female who presents to our clinic today with bilateral hand pain, numbness and tingling left greater than right upper extremity.  This is been ongoing for the past 4 years.  The pain she has is from her fingers all the way up to her elbow on both sides.  She was diagnosed with carpal tunnel syndrome about [place] ago in [place].  She does not have the results of that but was told she needed surgical intervention.  She notes occasional swelling as well to both sides.  She is starting to drop things that she is began to have weakness.  She has tried night splints without relief of symptoms.  Majority of her tingling is in the long, ring and small fingers but does note tingling to the thumb and index finger as well.  Review of Systems as  detailed in HPI.  All others reviewed and are negative.   Objective: Vital Signs: LMP 08/10/2014 (Approximate)   Physical Exam well-developed well-nourished female no acute distress.  Alert and oriented x3.  Ortho Exam examination of both upper extremities reveals a positive Tinel at the wrist and elbow.  Positive Phalen.  Decreased grip strength.  No thenar atrophy.  Full sensation distally.  Specialty Comments:  No specialty comments available.  Imaging: No new imaging   PMFS History: Patient Active Problem List   Diagnosis Date Noted  . Pain in both hands 12/20/2017  . Body mass index 40.0-44.9, adult (HCC) 12/20/2017  . Morbid obesity (HCC) 12/20/2017   Past Medical History:  Diagnosis Date  . Acid reflux   . Hepatitis C   . Hypertension     History reviewed. No pertinent family history.  Past Surgical History:  Procedure Laterality Date  . ABDOMINAL HYSTERECTOMY    . CHOLECYSTECTOMY     Social History   Occupational History  . Not on file  Tobacco Use  . Smoking status: Current Every Day Smoker    Packs/day: 0.50    Types: Cigarettes  . Smokeless tobacco: Never Used  Substance and Sexual Activity  . Alcohol use: No    Comment: occasion  .  Drug use: No  . Sexual activity: Not on file       

## 2018-01-23 ENCOUNTER — Encounter (INDEPENDENT_AMBULATORY_CARE_PROVIDER_SITE_OTHER): Payer: Self-pay | Admitting: Physical Medicine and Rehabilitation

## 2018-02-19 ENCOUNTER — Encounter (INDEPENDENT_AMBULATORY_CARE_PROVIDER_SITE_OTHER): Payer: Self-pay | Admitting: Physical Medicine and Rehabilitation

## 2018-06-18 ENCOUNTER — Ambulatory Visit (INDEPENDENT_AMBULATORY_CARE_PROVIDER_SITE_OTHER): Payer: Medicaid Other | Admitting: Orthopaedic Surgery

## 2018-06-24 ENCOUNTER — Encounter (INDEPENDENT_AMBULATORY_CARE_PROVIDER_SITE_OTHER): Payer: Self-pay | Admitting: Physician Assistant

## 2018-06-24 ENCOUNTER — Ambulatory Visit (INDEPENDENT_AMBULATORY_CARE_PROVIDER_SITE_OTHER): Payer: Medicaid Other

## 2018-06-24 ENCOUNTER — Ambulatory Visit (INDEPENDENT_AMBULATORY_CARE_PROVIDER_SITE_OTHER): Payer: Medicaid Other | Admitting: Physician Assistant

## 2018-06-24 ENCOUNTER — Ambulatory Visit (INDEPENDENT_AMBULATORY_CARE_PROVIDER_SITE_OTHER): Payer: Medicaid Other | Admitting: Orthopedic Surgery

## 2018-06-24 VITALS — Ht 67.0 in | Wt 283.0 lb

## 2018-06-24 DIAGNOSIS — M1711 Unilateral primary osteoarthritis, right knee: Secondary | ICD-10-CM | POA: Diagnosis not present

## 2018-06-25 ENCOUNTER — Encounter (INDEPENDENT_AMBULATORY_CARE_PROVIDER_SITE_OTHER): Payer: Self-pay | Admitting: Physician Assistant

## 2018-06-25 NOTE — Progress Notes (Signed)
Office Visit Note   Patient: ARRYN Fox           Date of Birth: 03/08/1981           MRN: [id number] Visit Date: 06/24/2018              Requested by: Center, [facility] [address] [place], [place] [address] PCP: Center, [facility]  Chief Complaint  Patient presents with  . Right Knee - Pain      HPI: Patient is a 37 year old female who is seen for follow-up of her chronic right knee pain.  She has known severe tricompartmental osteoarthritis of the right knee.  She reports that she is tried multiple medications including nonsteroidal anti-inflammatories and steroid injections and they only last for very short period of time.  She reports that the knee just feels stiff and stopped all the time and her pain is severe at times.  She reports that her pain in her right knee is inhibiting her activity greatly and she is unable to do much walking.  She utilizes a motorized cart in the grocery store or other stores.  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  She has been seen previously and been interested in having a total knee arthroplasty done and we did discuss that she may need to lose some weight to get her BMI under 40 in order to be considered for this surgery.  She reports she is not financially ready to have the surgery at this point but would like to have it early next year.  She does report that she is on Suboxone right now due to chronic pain issues and also is taking Naprosyn for the knee but does not feel that this does anything.  Assessment & Plan: Visit Diagnoses:  1. Primary osteoarthritis of right knee     Plan: The patient reports she would like to have surgery early next year.  We discussed that she needs to call at least 1 month prior to the time she is interested in having surgery in order to go through the total knee arthroplasty program.  We also discussed that she will need to work on losing weight to try to get  her BMI less than 40 to qualify for the surgery.  She will call back early next year to schedule an appointment.  Follow-Up Instructions: Return in about 6 weeks (around 08/05/2018).   Ortho Exam  Patient is alert, oriented, no adenopathy, well-dressed, normal affect, normal respiratory effort. Patient ambulates with an antalgic gait on the right.  The right knee range of motion lacks 5 degrees of extension and only has 85 degrees of flexion.  She has crepitus with range.  She has pain to palpation over the lateral joint line as well as over the kneecap with compression.  She has no instability with varus valgus stress and drawers are negative.  Imaging: Xr Knee 1-2 Views Right  Result Date: 06/25/2018 Radiographs of the right knee shows severe tricompartmental osteoarthritis  No images are attached to the encounter.  Labs: Lab Results  Component Value Date   REPTSTATUS 09/27/2012 FINAL 09/25/2012   CULT  09/25/2012    Multiple bacterial morphotypes present, none predominant. Suggest appropriate recollection if clinically indicated.     Lab Results  Component Value Date   ALBUMIN 3.6 09/25/2012    Body mass index is 44.32 kg/m.  Orders:  Orders Placed This  Encounter  Procedures  . XR Knee 1-2 Views Right   No orders of the defined types were placed in this encounter.    Procedures: No procedures performed  Clinical Data: No additional findings.  ROS:  All other systems negative, except as noted in the HPI. Review of Systems  Objective: Vital Signs: Ht 5\' 7"  (1.702 m)   Wt 283 lb (128.4 kg)   LMP 08/10/2014 (Approximate)   BMI 44.32 kg/m   Specialty Comments:  No specialty comments available.  PMFS History: Patient Active Problem List   Diagnosis Date Noted  . Pain in both hands 12/20/2017  . Body mass index 40.0-44.9, adult (HCC) 12/20/2017  . Morbid obesity (HCC) 12/20/2017   Past Medical History:  Diagnosis Date  . Acid reflux   . Hepatitis C    . Hypertension     History reviewed. No pertinent family history.  Past Surgical History:  Procedure Laterality Date  . ABDOMINAL HYSTERECTOMY    . CHOLECYSTECTOMY     Social History   Occupational History  . Not on file  Tobacco Use  . Smoking status: Current Every Day Smoker    Packs/day: 0.50    Types: Cigarettes  . Smokeless tobacco: Never Used  Substance and Sexual Activity  . Alcohol use: No    Comment: occasion  . Drug use: No  . Sexual activity: Not on file

## 2018-06-28 ENCOUNTER — Ambulatory Visit (INDEPENDENT_AMBULATORY_CARE_PROVIDER_SITE_OTHER): Payer: Medicaid Other | Admitting: Orthopaedic Surgery

## 2018-07-16 ENCOUNTER — Ambulatory Visit (INDEPENDENT_AMBULATORY_CARE_PROVIDER_SITE_OTHER): Payer: Medicaid Other | Admitting: Orthopaedic Surgery

## 2018-07-16 ENCOUNTER — Ambulatory Visit (INDEPENDENT_AMBULATORY_CARE_PROVIDER_SITE_OTHER): Payer: Medicaid Other

## 2018-07-16 DIAGNOSIS — M654 Radial styloid tenosynovitis [de Quervain]: Secondary | ICD-10-CM

## 2018-07-16 DIAGNOSIS — M25532 Pain in left wrist: Secondary | ICD-10-CM | POA: Insufficient documentation

## 2018-07-16 NOTE — Progress Notes (Signed)
   Office Visit Note   Patient: Cheryl Fox           Date of Birth: 02-10-1981           MRN: [id number] Visit Date: 07/16/2018              Requested by: Center, [facility] [address] [place], [place] [address] PCP: Center, [facility]   Assessment & Plan: Visit Diagnoses:  1. De Quervain's tenosynovitis, left     Plan: Impression is left wrist de Quervain's tenosynovitis.  We will refer the patient to Dr. Prince Rome for an ultrasound-guided cortisone injection to the first dorsal compartment left wrist.  Hopeful this will help settle things down.  She will continue to wear her thumb spica splint as needed.  Follow-up as needed.  Follow-Up Instructions: Return if symptoms worsen or fail to improve.   Orders:  Orders Placed This Encounter  Procedures  . XR Wrist Complete Left   No orders of the defined types were placed in this encounter.     Procedures: No procedures performed   Clinical Data: No additional findings.   Subjective: Chief Complaint  Patient presents with  . Left Wrist - Pain    HPI patient is a pleasant 38 year old female who presents to our clinic today with left wrist pain.  This began approximately 3 months ago without any known injury or change in activity.  About a month ago, she was seen by her PCP where she was thought to have what sounds like de Quervain's tenosynovitis and was given a cortisone injection.  She denies any relief of symptoms following the injection even during the anesthetic phase.  She comes in today for further evaluation treatment recommendation.  All of her pain is over the radial styloid.  Worse with radial and ulnar deviation.  No fevers or chills.  She has been wearing a thumb spica splint at night.  Review of Systems as detailed HPI.  All others reviewed and are negative.   Objective: Vital Signs: LMP 08/10/2014 (Approximate)   Physical Exam well-developed well-nourished female in no acute distress.  Alert  and oriented x3.  Ortho Exam examination of her left wrist reveals marked tenderness over the first dorsal compartment.  Markedly positive Lourena Simmonds.  Increased pain with radial and ulnar deviation.  No pain with flexion or extension.  She is neurovascularly intact distally.  Specialty Comments:  No specialty comments available.  Imaging: Xr Wrist Complete Left  Result Date: 07/16/2018 No acute or structural abnormalities    PMFS History: Patient Active Problem List   Diagnosis Date Noted  . Pain in left wrist 07/16/2018  . Pain in both hands 12/20/2017  . Body mass index 40.0-44.9, adult (HCC) 12/20/2017  . Morbid obesity (HCC) 12/20/2017   Past Medical History:  Diagnosis Date  . Acid reflux   . Hepatitis C   . Hypertension     No family history on file.  Past Surgical History:  Procedure Laterality Date  . ABDOMINAL HYSTERECTOMY    . CHOLECYSTECTOMY     Social History   Occupational History  . Not on file  Tobacco Use  . Smoking status: Current Every Day Smoker    Packs/day: 0.50    Types: Cigarettes  . Smokeless tobacco: Never Used  Substance and Sexual Activity  . Alcohol use: No    Comment: occasion  . Drug use: No  . Sexual activity: Not on file

## 2018-07-16 NOTE — Progress Notes (Signed)
   Procedure Note  Patient: Cheryl Fox             Date of Birth: 12/02/1980           MRN: [id number]             Visit Date: 07/16/2018  Procedures: Visit Diagnoses: Tommi Rumpse Quervain's tenosynovitis, left - Plan: XR Wrist Complete Left  Procedure performed: 1st Dorsal compartment tendon injection, US guided  Consent obtained and verified. Time-out conducted. Noted no overlying erythema, induration, or other signs of local infection. The LEFT 1st dorsal wrist compartment was identified with ultrasound. Fluid visualized with the tendon sheath consistent with de quervain's tenosynovitis.  The overlying skin was prepped in a sterile fashion. Topical analgesic spray: Ethyl chloride. Needle: 25-gauge, 1.5 inch Using an out of plane approach the needle was advanced into the tendon sheath and medications were injected.  Completed without difficulty. Meds: depomedrol 40mg , lidocaine 1cc.

## 2018-08-08 ENCOUNTER — Ambulatory Visit (INDEPENDENT_AMBULATORY_CARE_PROVIDER_SITE_OTHER): Payer: Medicaid Other | Admitting: Orthopedic Surgery

## 2018-09-02 ENCOUNTER — Encounter (INDEPENDENT_AMBULATORY_CARE_PROVIDER_SITE_OTHER): Payer: Self-pay | Admitting: Orthopedic Surgery

## 2018-09-02 ENCOUNTER — Ambulatory Visit (INDEPENDENT_AMBULATORY_CARE_PROVIDER_SITE_OTHER): Payer: Medicaid Other | Admitting: Orthopedic Surgery

## 2018-09-02 VITALS — Ht 67.0 in | Wt 279.8 lb

## 2018-09-02 DIAGNOSIS — M1711 Unilateral primary osteoarthritis, right knee: Secondary | ICD-10-CM | POA: Diagnosis not present

## 2018-09-02 DIAGNOSIS — Z6841 Body Mass Index (BMI) 40.0 and over, adult: Secondary | ICD-10-CM

## 2018-09-03 ENCOUNTER — Ambulatory Visit (INDEPENDENT_AMBULATORY_CARE_PROVIDER_SITE_OTHER): Payer: Self-pay | Admitting: Physician Assistant

## 2018-09-03 ENCOUNTER — Encounter (INDEPENDENT_AMBULATORY_CARE_PROVIDER_SITE_OTHER): Payer: Self-pay | Admitting: Orthopedic Surgery

## 2018-09-03 DIAGNOSIS — M1711 Unilateral primary osteoarthritis, right knee: Secondary | ICD-10-CM | POA: Insufficient documentation

## 2018-09-03 NOTE — Progress Notes (Signed)
Office Visit Note   Patient: Cheryl Fox           Date of Birth: 1980/11/02           MRN: [id number] Visit Date: 09/02/2018              Requested by: Center, [facility] [address] [place], [place] [address] PCP: Center, [facility]  Chief Complaint  Patient presents with  . Right Knee - Follow-up, Pain      HPI: Patient is a 38 year old woman with osteoarthritis of her right knee.  Patient states that she is failed conservative therapy wants to proceed with a total knee arthroplasty.  Assessment & Plan: Visit Diagnoses:  1. Primary osteoarthritis of right knee   2. Body mass index 40.0-44.9, adult (HCC)   3. Morbid obesity (HCC)     Plan: Discussed with the patient with her elevated BMI of 44 she is increased risk with surgery with increased risk of infection DVT nonhealing of the skin need for additional surgery.  Patient states she understands wished to proceed with surgery again discussed the importance of continuing to try to lose weight she states that she is recently lost about 25 pounds.  We will plan on discharge to home after surgery.  Follow-Up Instructions: Return in about 2 weeks (around 09/16/2018).   Ortho Exam  Patient is alert, oriented, no adenopathy, well-dressed, normal affect, normal respiratory effort. Examination patient has an antalgic gait she has no effusion she is globally tender to palpation around the right knee.  Collaterals and cruciates are stable.  Review of the radiographs shows collapse of the right knee with osteoarthritis with bone-on-bone contact of the medial lateral joint lines osteophytic bone spurs laterally with large osteophytic bone spurs in the patellofemoral joint as well as posteriorly.  Imaging: No results found. No images are attached to the encounter.  Labs: Lab Results  Component Value Date   REPTSTATUS 09/27/2012 FINAL 09/25/2012   CULT  09/25/2012    Multiple bacterial morphotypes present, none  predominant. Suggest appropriate recollection if clinically indicated.     Lab Results  Component Value Date   ALBUMIN 3.6 09/25/2012    Body mass index is 43.82 kg/m.  Orders:  No orders of the defined types were placed in this encounter.  No orders of the defined types were placed in this encounter.    Procedures: No procedures performed  Clinical Data: No additional findings.  ROS:  All other systems negative, except as noted in the HPI. Review of Systems  Objective: Vital Signs: Ht 5\' 7"  (1.702 m)   Wt 279 lb 12.8 oz (126.9 kg)   LMP 08/10/2014 (Approximate)   BMI 43.82 kg/m   Specialty Comments:  No specialty comments available.  PMFS History: Patient Active Problem List   Diagnosis Date Noted  . Primary osteoarthritis of right knee 09/03/2018  . Pain in left wrist 07/16/2018  . Pain in both hands 12/20/2017  . Body mass index 40.0-44.9, adult (HCC) 12/20/2017  . Morbid obesity (HCC) 12/20/2017   Past Medical History:  Diagnosis Date  . Acid reflux   . Hepatitis C   . Hypertension     History reviewed. No pertinent family history.  Past Surgical History:  Procedure Laterality Date  . ABDOMINAL HYSTERECTOMY    . CHOLECYSTECTOMY     Social History   Occupational History  . Not on file  Tobacco Use  . Smoking status: Current Every Day Smoker  Packs/day: 0.50    Types: Cigarettes  . Smokeless tobacco: Never Used  Substance and Sexual Activity  . Alcohol use: No    Comment: occasion  . Drug use: No  . Sexual activity: Not on file

## 2018-09-06 NOTE — Pre-Procedure Instructions (Signed)
Cheryl Fox  09/06/2018    Your procedure is scheduled on Wednesday, September 18, 2018 at 8:30 AM.   Report to [facility] Entrance "A" Admitting Office at 6:30 AM.   Call this number if you have problems the morning of surgery: [phone]   Questions prior to day of surgery, please call [phone] between 8 & 4 PM.   Remember:  Do not eat or drink after midnight Tuesday, 09/17/18.  Take these medicines the morning of surgery with A SIP OF WATER: Gabapentin (Neurontin), Lamotrigine (Lamictal), Lurasidone (Latuda), Omeprazole (Prilosec)  Stop Suboxone 3 days prior to surgery. Stop NSAIDS (Meloxicam, Mobic, Ibuprofen, Aleve, etc) 7 days prior to surgery. Do not use Aspirin products, Multivitamins or Herbal medications 7 days prior to surgery.  Do not smoke 24 hours prior to surgery.    Do not wear jewelry, make-up or nail polish.  Do not wear lotions, powders, cologne or deodorant.  Do not shave 48 hours prior to surgery.   Do not bring valuables to the hospital.  [facility] is not responsible for any belongings or valuables.  Contacts, dentures or bridgework may not be worn into surgery.  Leave your suitcase in the car.  After surgery it may be brought to your room.  For patients admitted to the hospital, discharge time will be determined by your treatment team.  [facility] - Preparing for Surgery  Before surgery, you can play an important role.  Because skin is not sterile, your skin needs to be as free of germs as possible.  You can reduce the number of germs on you skin by washing with CHG (chlorahexidine gluconate) soap before surgery.  CHG is an antiseptic cleaner which kills germs and bonds with the skin to continue killing germs even after washing.  Oral Hygiene is also important in reducing the risk of infection.  Remember to brush your teeth with your regular toothpaste the morning of surgery.  Please DO NOT use if you have an allergy to CHG or antibacterial  soaps.  If your skin becomes reddened/irritated stop using the CHG and inform your nurse when you arrive at Short Stay.  Do not shave (including legs and underarms) for at least 48 hours prior to the first CHG shower.  You may shave your face.  Please follow these instructions carefully:   1.  Shower with CHG Soap the night before surgery and the morning of Surgery.  2.  If you choose to wash your hair, wash your hair first as usual with your normal shampoo.  3.  After you shampoo, rinse your hair and body thoroughly to remove the shampoo. 4.  Use CHG as you would any other liquid soap.  You can apply chg directly to the skin and wash gently with a      scrungie or washcloth.           5.  Apply the CHG Soap to your body ONLY FROM THE NECK DOWN.   Do not use on open wounds or open sores. Avoid contact with your eyes, ears, mouth and genitals (private parts).  Wash genitals (private parts) with your normal soap.  6.  Wash thoroughly, paying special attention to the area where your surgery will be performed.  7.  Thoroughly rinse your body with warm water from the neck down.  8.  DO NOT shower/wash with your normal soap after using and rinsing off the CHG Soap.  9.  Pat yourself dry with a  clean towel.            10.  Wear clean pajamas.            11.  Place clean sheets on your bed the night of your first shower and do not sleep with pets.  Day of Surgery  Shower as above. Do not apply any lotions/deodorants the morning of surgery.   Please wear clean clothes to the hospital. Remember to brush your teeth with toothpaste.   Please read over the fact sheets that you were given.

## 2018-09-09 ENCOUNTER — Encounter (HOSPITAL_COMMUNITY)
Admission: RE | Admit: 2018-09-09 | Discharge: 2018-09-09 | Disposition: A | Discharge status: Home / Self Care | Payer: Medicaid Other | Source: Ambulatory Visit | Attending: Orthopedic Surgery | Admitting: Orthopedic Surgery

## 2018-09-09 ENCOUNTER — Encounter (HOSPITAL_COMMUNITY): Payer: Self-pay

## 2018-09-09 ENCOUNTER — Other Ambulatory Visit: Payer: Self-pay

## 2018-09-09 DIAGNOSIS — Z01818 Encounter for other preprocedural examination: Secondary | ICD-10-CM | POA: Insufficient documentation

## 2018-09-09 DIAGNOSIS — I10 Essential (primary) hypertension: Secondary | ICD-10-CM | POA: Diagnosis not present

## 2018-09-09 HISTORY — DX: Adverse effect of unspecified anesthetic, initial encounter: T41.45XA

## 2018-09-09 HISTORY — DX: Drug induced constipation: K59.03

## 2018-09-09 HISTORY — DX: Other complications of anesthesia, initial encounter: T88.59XA

## 2018-09-09 HISTORY — DX: Anxiety disorder, unspecified: F41.9

## 2018-09-09 HISTORY — DX: Unspecified osteoarthritis, unspecified site: M19.90

## 2018-09-09 HISTORY — DX: Depression, unspecified: F32.A

## 2018-09-09 HISTORY — DX: Major depressive disorder, single episode, unspecified: F32.9

## 2018-09-09 LAB — COMPREHENSIVE METABOLIC PANEL
ALT: 14 U/L (ref 0–44)
AST: 15 U/L (ref 15–41)
Albumin: 3.4 g/dL — ABNORMAL LOW (ref 3.5–5.0)
Alkaline Phosphatase: 62 U/L (ref 38–126)
Anion gap: 10 (ref 5–15)
BUN: 7 mg/dL (ref 6–20)
CO2: 23 mmol/L (ref 22–32)
Calcium: 9.1 mg/dL (ref 8.9–10.3)
Chloride: 104 mmol/L (ref 98–111)
Creatinine, Ser: 0.72 mg/dL (ref 0.44–1.00)
GFR calc non Af Amer: >60 mL/min (ref >60)
Glucose, Bld: 108 mg/dL — ABNORMAL HIGH (ref 70–99)
Potassium: 3.9 mmol/L (ref 3.5–5.1)
Sodium: 137 mmol/L (ref 135–145)
Total Bilirubin: 0.6 mg/dL (ref 0.3–1.2)
Total Protein: 6.6 g/dL (ref 6.5–8.1)

## 2018-09-09 LAB — CBC
HCT: 41.8 % (ref 36.0–46.0)
Hemoglobin: 13 g/dL (ref 12.0–15.0)
MCH: 25.8 pg — ABNORMAL LOW (ref 26.0–34.0)
MCHC: 31.1 g/dL (ref 30.0–36.0)
MCV: 82.9 fL (ref 80.0–100.0)
Platelets: 273 10*3/uL (ref 150–400)
RBC: 5.04 MIL/uL (ref 3.87–5.11)
RDW: 12.9 % (ref 11.5–15.5)
WBC: 6.8 10*3/uL (ref 4.0–10.5)
nRBC: 0 % (ref 0.0–0.2)

## 2018-09-09 LAB — SURGICAL PCR SCREEN
MRSA, PCR: NEGATIVE
STAPHYLOCOCCUS AUREUS: NEGATIVE

## 2018-09-09 NOTE — Progress Notes (Signed)
PCP - [facility] Cardiologist - N/a  Chest x-ray -  EKG - today Stress Test - denies ECHO - denies Cardiac Cath - denies  Sleep Study - N/a CPAP -   Fasting Blood Sugar - N/a Checks Blood Sugar _____ times a day  Blood Thinner Instructions: N/a Aspirin Instructions: N/a  Anesthesia review: no  Patient denies shortness of breath, fever, cough and chest pain at PAT appointment   Patient verbalized understanding of instructions that were given to them at the PAT appointment. Patient was also instructed that they will need to review over the PAT instructions again at home before surgery.

## 2018-09-17 ENCOUNTER — Ambulatory Visit (INDEPENDENT_AMBULATORY_CARE_PROVIDER_SITE_OTHER): Payer: Self-pay | Admitting: Physician Assistant

## 2018-09-17 MED ORDER — TRANEXAMIC ACID-NACL 1000-0.7 MG/100ML-% IV SOLN
1000.0000 mg | INTRAVENOUS | Status: AC
  Administered 2018-09-18: 1000 mg via INTRAVENOUS
  Filled 2018-09-17: qty 100

## 2018-09-17 MED ORDER — TRANEXAMIC ACID 1000 MG/10ML IV SOLN
2000.0000 mg | INTRAVENOUS | Status: AC
  Administered 2018-09-18: 2000 mg via TOPICAL
  Filled 2018-09-17: qty 20

## 2018-09-17 MED ORDER — CLINDAMYCIN PHOSPHATE 900 MG/50ML IV SOLN
900.0000 mg | INTRAVENOUS | Status: AC
  Administered 2018-09-18: 900 mg via INTRAVENOUS
  Filled 2018-09-17: qty 50

## 2018-09-17 NOTE — Anesthesia Preprocedure Evaluation (Addendum)
Anesthesia Evaluation  Patient identified by MRN, date of birth, ID band Patient awake    Reviewed: Allergy & Precautions, H&P , NPO status , Patient's Chart, lab work & pertinent test results  Airway Mallampati: II  TM Distance: >3 FB Neck ROM: Full    Dental no notable dental hx. (+) Teeth Intact, Dental Advisory Given   Pulmonary neg pulmonary ROS, former smoker,    Pulmonary exam normal breath sounds clear to auscultation       Cardiovascular Exercise Tolerance: Good hypertension, Pt. on medications  Rhythm:Regular Rate:Normal     Neuro/Psych Anxiety Depression negative neurological ROS     GI/Hepatic GERD  Medicated and Controlled,(+) Hepatitis -, C  Endo/Other  Morbid obesity  Renal/GU negative Renal ROS  negative genitourinary   Musculoskeletal  (+) Arthritis ,   Abdominal   Peds  Hematology negative hematology ROS (+)   Anesthesia Other Findings   Reproductive/Obstetrics negative OB ROS                            Anesthesia Physical Anesthesia Plan  ASA: III  Anesthesia Plan: Spinal   Post-op Pain Management:  Regional for Post-op pain   Induction: Intravenous  PONV Risk Score and Plan: 3 and Ondansetron, Dexamethasone, Midazolam and Propofol infusion  Airway Management Planned: Simple Face Mask  Additional Equipment:   Intra-op Plan:   Post-operative Plan:   Informed Consent: I have reviewed the patients History and Physical, chart, labs and discussed the procedure including the risks, benefits and alternatives for the proposed anesthesia with the patient or authorized representative who has indicated his/her understanding and acceptance.     Dental advisory given  Plan Discussed with: CRNA  Anesthesia Plan Comments:         Anesthesia Quick Evaluation

## 2018-09-18 ENCOUNTER — Encounter (HOSPITAL_COMMUNITY)
Admission: RE | Disposition: A | Discharge status: Home Health | Payer: Self-pay | Source: Home / Self Care | Attending: Orthopedic Surgery

## 2018-09-18 ENCOUNTER — Inpatient Hospital Stay (HOSPITAL_COMMUNITY)
Admission: RE | Admit: 2018-09-18 | Discharge: 2018-09-20 | DRG: 470 | Disposition: A | Discharge status: Home Health | Payer: Medicaid Other | Attending: Orthopedic Surgery | Admitting: Orthopedic Surgery

## 2018-09-18 ENCOUNTER — Encounter (HOSPITAL_COMMUNITY): Payer: Self-pay | Admitting: *Deleted

## 2018-09-18 ENCOUNTER — Inpatient Hospital Stay (HOSPITAL_COMMUNITY): Payer: Medicaid Other | Admitting: Anesthesiology

## 2018-09-18 DIAGNOSIS — M879 Osteonecrosis, unspecified: Secondary | ICD-10-CM | POA: Diagnosis present

## 2018-09-18 DIAGNOSIS — M1711 Unilateral primary osteoarthritis, right knee: Principal | ICD-10-CM | POA: Diagnosis present

## 2018-09-18 DIAGNOSIS — Z885 Allergy status to narcotic agent status: Secondary | ICD-10-CM

## 2018-09-18 DIAGNOSIS — Z96651 Presence of right artificial knee joint: Secondary | ICD-10-CM

## 2018-09-18 DIAGNOSIS — Z90710 Acquired absence of both cervix and uterus: Secondary | ICD-10-CM

## 2018-09-18 DIAGNOSIS — Z886 Allergy status to analgesic agent status: Secondary | ICD-10-CM

## 2018-09-18 DIAGNOSIS — Z880 Allergy status to penicillin: Secondary | ICD-10-CM

## 2018-09-18 DIAGNOSIS — Z888 Allergy status to other drugs, medicaments and biological substances status: Secondary | ICD-10-CM

## 2018-09-18 DIAGNOSIS — Z6841 Body Mass Index (BMI) 40.0 and over, adult: Secondary | ICD-10-CM

## 2018-09-18 DIAGNOSIS — Z87891 Personal history of nicotine dependence: Secondary | ICD-10-CM

## 2018-09-18 DIAGNOSIS — I10 Essential (primary) hypertension: Secondary | ICD-10-CM | POA: Diagnosis present

## 2018-09-18 DIAGNOSIS — K219 Gastro-esophageal reflux disease without esophagitis: Secondary | ICD-10-CM | POA: Diagnosis present

## 2018-09-18 DIAGNOSIS — Z9049 Acquired absence of other specified parts of digestive tract: Secondary | ICD-10-CM

## 2018-09-18 DIAGNOSIS — M25761 Osteophyte, right knee: Secondary | ICD-10-CM | POA: Diagnosis present

## 2018-09-18 HISTORY — PX: TOTAL KNEE ARTHROPLASTY: SHX125

## 2018-09-18 HISTORY — PX: APPLICATION OF WOUND VAC: SHX5189

## 2018-09-18 LAB — POCT PREGNANCY, URINE: Preg Test, Ur: NEGATIVE

## 2018-09-18 SURGERY — ARTHROPLASTY, KNEE, TOTAL
Anesthesia: Spinal | Site: Knee | Laterality: Right

## 2018-09-18 MED ORDER — DOCUSATE SODIUM 100 MG PO CAPS
100.0000 mg | ORAL_CAPSULE | Freq: Two times a day (BID) | ORAL | Status: DC
  Administered 2018-09-18 – 2018-09-20 (×6): 100 mg via ORAL
  Filled 2018-09-18 (×5): qty 1

## 2018-09-18 MED ORDER — MIDAZOLAM HCL 2 MG/2ML IJ SOLN
INTRAMUSCULAR | Status: AC
  Filled 2018-09-18: qty 2

## 2018-09-18 MED ORDER — PROMETHAZINE HCL 25 MG/ML IJ SOLN: 6.2500 mg | Freq: Once | INTRAMUSCULAR | Status: DC | PRN

## 2018-09-18 MED ORDER — LAMOTRIGINE 25 MG PO TABS
25.0000 mg | ORAL_TABLET | Freq: Every day | ORAL | Status: DC
  Administered 2018-09-18 – 2018-09-20 (×3): 25 mg via ORAL
  Filled 2018-09-18 (×3): qty 1

## 2018-09-18 MED ORDER — SODIUM CHLORIDE 0.9 % IV SOLN
INTRAVENOUS | Status: DC
  Administered 2018-09-18: 13:00:00 via INTRAVENOUS

## 2018-09-18 MED ORDER — MENTHOL 3 MG MT LOZG: 1.0000 | LOZENGE | OROMUCOSAL | Status: DC | PRN

## 2018-09-18 MED ORDER — BUPRENORPHINE HCL 2 MG SL SUBL
8.0000 mg | SUBLINGUAL_TABLET | Freq: Three times a day (TID) | SUBLINGUAL | Status: DC
  Administered 2018-09-18: 8 mg via SUBLINGUAL
  Filled 2018-09-18: qty 4

## 2018-09-18 MED ORDER — HYDROMORPHONE HCL 1 MG/ML IJ SOLN
2.0000 mg | Freq: Once | INTRAMUSCULAR | Status: AC
  Administered 2018-09-18: 2 mg via INTRAVENOUS
  Filled 2018-09-18: qty 2

## 2018-09-18 MED ORDER — METOCLOPRAMIDE HCL 5 MG PO TABS: 5.0000 mg | ORAL_TABLET | Freq: Three times a day (TID) | ORAL | Status: DC | PRN

## 2018-09-18 MED ORDER — SODIUM CHLORIDE 0.9 % IV SOLN
INTRAVENOUS | Status: DC | PRN
  Administered 2018-09-18: 15 ug/min via INTRAVENOUS

## 2018-09-18 MED ORDER — BUPIVACAINE-EPINEPHRINE (PF) 0.5% -1:200000 IJ SOLN
INTRAMUSCULAR | Status: DC | PRN
  Administered 2018-09-18: 20 mL via PERINEURAL

## 2018-09-18 MED ORDER — PROPOFOL 10 MG/ML IV BOLUS
INTRAVENOUS | Status: AC
  Filled 2018-09-18: qty 20

## 2018-09-18 MED ORDER — KETOROLAC TROMETHAMINE 30 MG/ML IJ SOLN
30.0000 mg | Freq: Four times a day (QID) | INTRAMUSCULAR | Status: DC | PRN
  Administered 2018-09-18 – 2018-09-19 (×2): 30 mg via INTRAVENOUS
  Filled 2018-09-18 (×3): qty 1

## 2018-09-18 MED ORDER — HYDROMORPHONE HCL 1 MG/ML IJ SOLN
2.0000 mg | INTRAMUSCULAR | Status: DC | PRN
  Administered 2018-09-18: 2 mg via INTRAVENOUS

## 2018-09-18 MED ORDER — ACETAMINOPHEN 325 MG PO TABS
325.0000 mg | ORAL_TABLET | Freq: Four times a day (QID) | ORAL | Status: DC | PRN
  Administered 2018-09-19 – 2018-09-20 (×2): 650 mg via ORAL
  Filled 2018-09-18 (×2): qty 2

## 2018-09-18 MED ORDER — ONDANSETRON HCL 4 MG/2ML IJ SOLN: 4.0000 mg | Freq: Four times a day (QID) | INTRAMUSCULAR | Status: DC | PRN

## 2018-09-18 MED ORDER — ONDANSETRON HCL 4 MG/2ML IJ SOLN
INTRAMUSCULAR | Status: AC
  Filled 2018-09-18: qty 6

## 2018-09-18 MED ORDER — LACTATED RINGERS IV SOLN
INTRAVENOUS | Status: DC | PRN
  Administered 2018-09-18 (×2): via INTRAVENOUS

## 2018-09-18 MED ORDER — PHENOL 1.4 % MT LIQD: 1.0000 | OROMUCOSAL | Status: DC | PRN

## 2018-09-18 MED ORDER — GABAPENTIN 400 MG PO CAPS
800.0000 mg | ORAL_CAPSULE | Freq: Two times a day (BID) | ORAL | Status: DC
  Administered 2018-09-18 – 2018-09-19 (×4): 800 mg via ORAL
  Filled 2018-09-18 (×4): qty 2

## 2018-09-18 MED ORDER — METOCLOPRAMIDE HCL 5 MG/ML IJ SOLN: 5.0000 mg | Freq: Three times a day (TID) | INTRAMUSCULAR | Status: DC | PRN

## 2018-09-18 MED ORDER — LURASIDONE HCL 40 MG PO TABS
40.0000 mg | ORAL_TABLET | Freq: Every day | ORAL | Status: DC
  Administered 2018-09-19 – 2018-09-20 (×2): 40 mg via ORAL
  Filled 2018-09-18 (×2): qty 1

## 2018-09-18 MED ORDER — ONDANSETRON HCL 4 MG PO TABS: 4.0000 mg | ORAL_TABLET | Freq: Four times a day (QID) | ORAL | Status: DC | PRN

## 2018-09-18 MED ORDER — DEXAMETHASONE SODIUM PHOSPHATE 10 MG/ML IJ SOLN
INTRAMUSCULAR | Status: AC
  Filled 2018-09-18: qty 3

## 2018-09-18 MED ORDER — BUPRENORPHINE HCL 2 MG SL SUBL: 2.0000 mg | SUBLINGUAL_TABLET | Freq: Every day | SUBLINGUAL | Status: DC

## 2018-09-18 MED ORDER — OXYCODONE HCL 5 MG PO TABS
10.0000 mg | ORAL_TABLET | ORAL | Status: DC | PRN
  Administered 2018-09-18 – 2018-09-19 (×5): 15 mg via ORAL
  Administered 2018-09-19: 10 mg via ORAL
  Administered 2018-09-19 – 2018-09-20 (×3): 15 mg via ORAL
  Filled 2018-09-18 (×8): qty 3

## 2018-09-18 MED ORDER — HYDROMORPHONE HCL 1 MG/ML IJ SOLN: 0.2500 mg | INTRAMUSCULAR | Status: DC | PRN

## 2018-09-18 MED ORDER — ONDANSETRON HCL 4 MG/2ML IJ SOLN
INTRAMUSCULAR | Status: DC | PRN
  Administered 2018-09-18: 4 mg via INTRAVENOUS

## 2018-09-18 MED ORDER — FENTANYL CITRATE (PF) 250 MCG/5ML IJ SOLN
INTRAMUSCULAR | Status: DC | PRN
  Administered 2018-09-18 (×2): 25 ug via INTRAVENOUS
  Administered 2018-09-18: 50 ug via INTRAVENOUS

## 2018-09-18 MED ORDER — OXYCODONE HCL 5 MG PO TABS
5.0000 mg | ORAL_TABLET | ORAL | Status: DC | PRN
  Administered 2018-09-18: 10 mg via ORAL

## 2018-09-18 MED ORDER — PROMETHAZINE HCL 25 MG/ML IJ SOLN
INTRAMUSCULAR | Status: AC
  Filled 2018-09-18: qty 1

## 2018-09-18 MED ORDER — ACETAMINOPHEN 500 MG PO TABS
1000.0000 mg | ORAL_TABLET | Freq: Once | ORAL | Status: AC
  Administered 2018-09-18: 1000 mg via ORAL
  Filled 2018-09-18: qty 2

## 2018-09-18 MED ORDER — ZOLPIDEM TARTRATE 5 MG PO TABS: 5.0000 mg | ORAL_TABLET | Freq: Every evening | ORAL | Status: DC | PRN

## 2018-09-18 MED ORDER — BUPIVACAINE IN DEXTROSE 0.75-8.25 % IT SOLN
INTRATHECAL | Status: DC | PRN
  Administered 2018-09-18: 1.6 mL via INTRATHECAL

## 2018-09-18 MED ORDER — HYDROMORPHONE HCL 1 MG/ML IJ SOLN
INTRAMUSCULAR | Status: AC
  Administered 2018-09-18: 2 mg via INTRAVENOUS
  Filled 2018-09-18: qty 2

## 2018-09-18 MED ORDER — POLYETHYLENE GLYCOL 3350 17 G PO PACK: 17.0000 g | PACK | Freq: Every day | ORAL | Status: DC | PRN

## 2018-09-18 MED ORDER — FENTANYL CITRATE (PF) 250 MCG/5ML IJ SOLN
INTRAMUSCULAR | Status: AC
  Filled 2018-09-18: qty 5

## 2018-09-18 MED ORDER — HYDROMORPHONE HCL 1 MG/ML IJ SOLN
0.5000 mg | INTRAMUSCULAR | Status: DC | PRN
  Administered 2018-09-18: 1 mg via INTRAVENOUS

## 2018-09-18 MED ORDER — PROPOFOL 10 MG/ML IV BOLUS
INTRAVENOUS | Status: DC | PRN
  Administered 2018-09-18: 30 mg via INTRAVENOUS
  Administered 2018-09-18: 20 mg via INTRAVENOUS

## 2018-09-18 MED ORDER — MIDAZOLAM HCL 5 MG/5ML IJ SOLN
INTRAMUSCULAR | Status: DC | PRN
  Administered 2018-09-18: 2 mg via INTRAVENOUS

## 2018-09-18 MED ORDER — BISACODYL 10 MG RE SUPP: 10.0000 mg | Freq: Every day | RECTAL | Status: DC | PRN

## 2018-09-18 MED ORDER — MAGNESIUM CITRATE PO SOLN: 1.0000 | Freq: Once | ORAL | Status: DC | PRN

## 2018-09-18 MED ORDER — CLINDAMYCIN PHOSPHATE 600 MG/50ML IV SOLN
600.0000 mg | Freq: Four times a day (QID) | INTRAVENOUS | Status: AC
  Administered 2018-09-18 (×2): 600 mg via INTRAVENOUS
  Filled 2018-09-18 (×2): qty 50

## 2018-09-18 MED ORDER — PROPOFOL 500 MG/50ML IV EMUL
INTRAVENOUS | Status: DC | PRN
  Administered 2018-09-18: 100 ug/kg/min via INTRAVENOUS
  Administered 2018-09-18: 10:00:00 via INTRAVENOUS

## 2018-09-18 MED ORDER — 0.9 % SODIUM CHLORIDE (POUR BTL) OPTIME
TOPICAL | Status: DC | PRN
  Administered 2018-09-18: 1000 mL

## 2018-09-18 MED ORDER — CHLORHEXIDINE GLUCONATE 4 % EX LIQD: 60.0000 mL | Freq: Once | CUTANEOUS | Status: DC

## 2018-09-18 MED ORDER — HYDROMORPHONE HCL 1 MG/ML IJ SOLN: 0.5000 mg | INTRAMUSCULAR | Status: DC | PRN

## 2018-09-18 MED ORDER — HYDROMORPHONE HCL 1 MG/ML IJ SOLN
1.0000 mg | INTRAMUSCULAR | Status: DC | PRN
  Administered 2018-09-18 – 2018-09-20 (×8): 1 mg via INTRAVENOUS
  Filled 2018-09-18 (×9): qty 1

## 2018-09-18 MED ORDER — ASPIRIN EC 325 MG PO TBEC
325.0000 mg | DELAYED_RELEASE_TABLET | Freq: Every day | ORAL | Status: DC
  Administered 2018-09-18 – 2018-09-20 (×3): 325 mg via ORAL
  Filled 2018-09-18 (×3): qty 1

## 2018-09-18 MED ORDER — OXYCODONE HCL 5 MG PO TABS
ORAL_TABLET | ORAL | Status: AC
  Filled 2018-09-18: qty 2

## 2018-09-18 MED ORDER — DIPHENHYDRAMINE HCL 12.5 MG/5ML PO ELIX: 12.5000 mg | ORAL_SOLUTION | ORAL | Status: DC | PRN

## 2018-09-18 MED ORDER — PROPOFOL 1000 MG/100ML IV EMUL
INTRAVENOUS | Status: AC
  Filled 2018-09-18: qty 200

## 2018-09-18 MED ORDER — SODIUM CHLORIDE 0.9 % IR SOLN
Status: DC | PRN
  Administered 2018-09-18: 3000 mL

## 2018-09-18 SURGICAL SUPPLY — 61 items
ATTUNE PS FEM RT SZ 5 CEM KNEE (Femur) ×2 IMPLANT
BASEPLATE TIB CMT FB PCKT SZ6 (Knees) ×2 IMPLANT
BLADE SAGITTAL 25.0X1.19X90 (BLADE) ×2 IMPLANT
BLADE SAGITTAL 25.0X1.19X90MM (BLADE) ×1
BLADE SAW SGTL 13X75X1.27 (BLADE) ×3 IMPLANT
BLADE SURG 21 STRL SS (BLADE) ×6 IMPLANT
BNDG COHESIVE 6X5 TAN STRL LF (GAUZE/BANDAGES/DRESSINGS) ×4 IMPLANT
BNDG GAUZE ELAST 4 BULKY (GAUZE/BANDAGES/DRESSINGS) ×1 IMPLANT
BONE CEMENT PALACOSE (Cement) ×6 IMPLANT
BOWL SMART MIX CTS (DISPOSABLE) ×3 IMPLANT
CANISTER WOUNDNEG PRESSURE 500 (CANNISTER) ×2 IMPLANT
CEMENT BONE PALACOSE (Cement) ×2 IMPLANT
COVER SURGICAL LIGHT HANDLE (MISCELLANEOUS) ×3 IMPLANT
COVER WAND RF STERILE (DRAPES) ×3 IMPLANT
CUFF TOURNIQUET SINGLE 34IN LL (TOURNIQUET CUFF) ×1 IMPLANT
CUFF TOURNIQUET SINGLE 44IN (TOURNIQUET CUFF) IMPLANT
DRAPE EXTREMITY T 121X128X90 (DISPOSABLE) ×3 IMPLANT
DRAPE HALF SHEET 40X57 (DRAPES) ×6 IMPLANT
DRAPE U-SHAPE 47X51 STRL (DRAPES) ×3 IMPLANT
DRESSING PREVENA PLUS CUSTOM (GAUZE/BANDAGES/DRESSINGS) IMPLANT
DRSG ADAPTIC 3X8 NADH LF (GAUZE/BANDAGES/DRESSINGS) ×1 IMPLANT
DRSG PAD ABDOMINAL 8X10 ST (GAUZE/BANDAGES/DRESSINGS) ×1 IMPLANT
DRSG PREVENA PLUS CUSTOM (GAUZE/BANDAGES/DRESSINGS) ×3
DURAPREP 26ML APPLICATOR (WOUND CARE) ×3 IMPLANT
ELECT REM PT RETURN 9FT ADLT (ELECTROSURGICAL) ×3
ELECTRODE REM PT RTRN 9FT ADLT (ELECTROSURGICAL) ×1 IMPLANT
FACESHIELD WRAPAROUND (MASK) ×3 IMPLANT
FACESHIELD WRAPAROUND OR TEAM (MASK) ×1 IMPLANT
GAUZE SPONGE 4X4 12PLY STRL (GAUZE/BANDAGES/DRESSINGS) ×1 IMPLANT
GLOVE BIO SURGEON STRL SZ 6.5 (GLOVE) ×2 IMPLANT
GLOVE BIO SURGEONS STRL SZ 6.5 (GLOVE) ×2
GLOVE BIOGEL PI IND STRL 9 (GLOVE) ×1 IMPLANT
GLOVE BIOGEL PI INDICATOR 9 (GLOVE) ×2
GLOVE SURG ORTHO 9.0 STRL STRW (GLOVE) ×3 IMPLANT
GOWN STRL REUS W/ TWL LRG LVL3 (GOWN DISPOSABLE) IMPLANT
GOWN STRL REUS W/ TWL XL LVL3 (GOWN DISPOSABLE) ×2 IMPLANT
GOWN STRL REUS W/TWL LRG LVL3 (GOWN DISPOSABLE) ×6
GOWN STRL REUS W/TWL XL LVL3 (GOWN DISPOSABLE) ×2
HANDPIECE INTERPULSE COAX TIP (DISPOSABLE) ×2
INSERT TIB FIX BEARNG SZ 5 5MM (Insert) ×2 IMPLANT
KIT BASIN OR (CUSTOM PROCEDURE TRAY) ×3 IMPLANT
KIT TURNOVER KIT B (KITS) ×3 IMPLANT
MANIFOLD NEPTUNE II (INSTRUMENTS) ×3 IMPLANT
NS IRRIG 1000ML POUR BTL (IV SOLUTION) ×3 IMPLANT
PACK TOTAL JOINT (CUSTOM PROCEDURE TRAY) ×3 IMPLANT
PAD ARMBOARD 7.5X6 YLW CONV (MISCELLANEOUS) ×3 IMPLANT
PATELLA MEDIAL ATTUN 35MM KNEE (Knees) ×2 IMPLANT
PIN STEINMAN FIXATION KNEE (PIN) ×2 IMPLANT
PREVENA RESTOR ARTHOFORM 46X30 (CANNISTER) ×2 IMPLANT
SET HNDPC FAN SPRY TIP SCT (DISPOSABLE) ×1 IMPLANT
SPONGE LAP 18X18 X RAY DECT (DISPOSABLE) ×2 IMPLANT
STAPLER VISISTAT 35W (STAPLE) ×3 IMPLANT
SUCTION FRAZIER HANDLE 10FR (MISCELLANEOUS)
SUCTION TUBE FRAZIER 10FR DISP (MISCELLANEOUS) IMPLANT
SUT VIC AB 0 CT1 27 (SUTURE) ×2
SUT VIC AB 0 CT1 27XBRD ANBCTR (SUTURE) ×1 IMPLANT
SUT VIC AB 1 CTX 36 (SUTURE) ×4
SUT VIC AB 1 CTX36XBRD ANBCTR (SUTURE) IMPLANT
TOWEL OR 17X24 6PK STRL BLUE (TOWEL DISPOSABLE) ×3 IMPLANT
TOWEL OR 17X26 10 PK STRL BLUE (TOWEL DISPOSABLE) ×3 IMPLANT
WRAP KNEE MAXI GEL POST OP (GAUZE/BANDAGES/DRESSINGS) ×3 IMPLANT

## 2018-09-18 NOTE — Anesthesia Procedure Notes (Signed)
Spinal  Patient location during procedure: OR Start time: 09/18/2018 8:33 AM End time: 09/18/2018 8:37 AM Staffing Anesthesiologist: Gaynelle Adu, MD Performed: anesthesiologist  Preanesthetic Checklist Completed: patient identified, surgical consent, pre-op evaluation, timeout performed, IV checked, risks and benefits discussed and monitors and equipment checked Spinal Block Patient position: sitting Prep: DuraPrep Patient monitoring: cardiac monitor, continuous pulse ox and blood pressure Approach: midline Location: L3-4 Injection technique: single-shot Needle Needle type: Pencan  Needle gauge: 24 G Needle length: 9 cm Assessment Sensory level: T8 Additional Notes Functioning IV was confirmed and monitors were applied. Sterile prep and drape, including hand hygiene and sterile gloves were used. The patient was positioned and the spine was prepped. The skin was anesthetized with lidocaine.  Free flow of clear CSF was obtained prior to injecting local anesthetic into the CSF.  The spinal needle aspirated freely following injection.  The needle was carefully withdrawn.  The patient tolerated the procedure well.

## 2018-09-18 NOTE — Progress Notes (Signed)
Physical Therapy Evaluation Patient Details Name: Cheryl Fox MRN: [id number] DOB: [birth year] Today's Date: 09/18/2018   History of Present Illness  Patient is 38 y/o female s/p R TKA. PMH includes morbid obesity, hep C, HTN, and anxiety.   Clinical Impression  Patient admitted to hospital secondary to problems above and with deficits below. Patient required minA for bed mobility and transfers to [facility]. Patient with limited weight bearing on RLE due to pain level so had to perform squat pivot transfer. Functional mobility limited secondary to high pain levels. Reviewed supine HEP with patient and wife. Patient will benefit from acute physical therapy to maximize independence and safety with functional mobility.     Follow Up Recommendations Follow surgeon's recommendation for DC plan and follow-up therapies    Equipment Recommendations  Rolling walker with 5" wheels;3in1 (PT)    Recommendations for Other Services       Precautions / Restrictions Precautions Precautions: Fall;Knee Precaution Booklet Issued: Yes (comment) Precaution Comments: reviewed knee precautions with patient Restrictions Weight Bearing Restrictions: Yes RLE Weight Bearing: Weight bearing as tolerated      Mobility  Bed Mobility Overal bed mobility: Needs Assistance Bed Mobility: Supine to Sit;Sit to Supine     Supine to sit: Min assist Sit to supine: Min assist   General bed mobility comments: Patient required minA for RLE management for all bed mobility secondary to pain  Transfers Overall transfer level: Needs assistance Equipment used: None Transfers: Sit to/from [employer] Sit to Stand: Min assist   Squat pivot transfers: Min assist     General transfer comment: Patient required minA to stand and perform squat pivot to and from [facility]. Patient with limited weight bearing on RLE. Functional mobility limited secondary to high pain levels.   Ambulation/Gait                 Stairs            Wheelchair Mobility    Modified Rankin (Stroke Patients Only)       Balance Overall balance assessment: Needs assistance Sitting-balance support: Bilateral upper extremity supported;Feet supported Sitting balance-Leahy Scale: Poor Sitting balance - Comments: reliant on BUE support                                     Pertinent Vitals/Pain Pain Assessment: 0-10 Pain Score: 10-Worst pain ever Pain Location: R knee Pain Descriptors / Indicators: Crying;Operative site guarding;Guarding Pain Intervention(s): Limited activity within patient's tolerance;Monitored during session;Ice applied    Home Living Family/patient expects to be discharged to:: Private residence Living Arrangements: Spouse/significant other Available Help at Discharge: Family;Available 24 hours/day Type of Home: Mobile home Home Access: Stairs to enter Entrance Stairs-Rails: Right;Left;Can reach both Entrance Stairs-Number of Steps: 6 Home Layout: One level Home Equipment: None      Prior Function Level of Independence: Independent               Hand Dominance        Extremity/Trunk Assessment   Upper Extremity Assessment Upper Extremity Assessment: Overall WFL for tasks assessed    Lower Extremity Assessment Lower Extremity Assessment: RLE deficits/detail RLE Deficits / Details: RLE deficits consistent with post op pain and weakness    Cervical / Trunk Assessment Cervical / Trunk Assessment: Normal  Communication   Communication: No difficulties  Cognition Arousal/Alertness: Awake/alert Behavior During Therapy: Restless Overall Cognitive Status: Within Functional Limits for  tasks assessed                                        General Comments General comments (skin integrity, edema, etc.): Patient wife in room during session    Exercises Total Joint Exercises Ankle Circles/Pumps: AROM;Both;Supine;Other reps  (comment)(1) Quad Sets: AROM;Right;Supine;Other reps (comment)(1) Heel Slides: AROM;Right;Other reps (comment);Supine(1)   Assessment/Plan    PT Assessment Patient needs continued PT services  PT Problem List Decreased strength;Decreased range of motion;Decreased activity tolerance;Decreased balance;Decreased mobility;Decreased knowledge of use of DME;Decreased knowledge of precautions;Pain       PT Treatment Interventions DME instruction;Gait training;Functional mobility training;Stair training;Therapeutic activities;Therapeutic exercise;Balance training;Patient/family education    PT Goals (Current goals can be found in the Care Plan section)  Acute Rehab PT Goals Patient Stated Goal: "be in less pain" PT Goal Formulation: With patient Time For Goal Achievement: 10/02/18 Potential to Achieve Goals: Good    Frequency 7X/week   Barriers to discharge        Co-evaluation               AM-PAC PT "6 Clicks" Mobility  Outcome Measure Help needed turning from your back to your side while in a flat bed without using bedrails?: None Help needed moving from lying on your back to sitting on the side of a flat bed without using bedrails?: A Little Help needed moving to and from a bed to a chair (including a wheelchair)?: A Little Help needed standing up from a chair using your arms (e.g., wheelchair or bedside chair)?: A Little Help needed to walk in hospital room?: Total Help needed climbing 3-5 steps with a railing? : Total 6 Click Score: 15    End of Session Equipment Utilized During Treatment: Gait belt Activity Tolerance: Patient limited by pain Patient left: in bed;with call bell/phone within reach;with family/visitor present Nurse Communication: Mobility status PT Visit Diagnosis: Other abnormalities of gait and mobility (R26.89);Muscle weakness (generalized) (M62.81);Difficulty in walking, not elsewhere classified (R26.2);Pain Pain - Right/Left: Right Pain - part of  body: Knee    Time: 4650-3546 PT Time Calculation (min) (ACUTE ONLY): 27 min   Charges:   PT Evaluation $PT Eval Low Complexity: 1 Low PT Treatments $Therapeutic Activity: 8-22 mins        Vanessa Ralphs, SPT  Vanessa Ralphs 09/18/2018, 4:36 PM

## 2018-09-18 NOTE — Discharge Instructions (Signed)
INSTRUCTIONS AFTER JOINT REPLACEMENT   o Remove items at home which could result in a fall. This includes throw rugs or furniture in walking pathways o ICE to the affected joint every three hours while awake for 30 minutes at a time, for at least the first 3-5 days, and then as needed for pain and swelling.  Continue to use ice for pain and swelling. You may notice swelling that will progress down to the foot and ankle.  This is normal after surgery.  Elevate your leg when you are not up walking on it.   o Continue to use the breathing machine you got in the hospital (incentive spirometer) which will help keep your temperature down.  It is common for your temperature to cycle up and down following surgery, especially at night when you are not up moving around and exerting yourself.  The breathing machine keeps your lungs expanded and your temperature down.   DIET:  As you were doing prior to hospitalization, we recommend a well-balanced diet.  DRESSING / WOUND CARE / SHOWERING Leave wound VAC in place until follow-up in the office.  Plug in the wound VAC pump to ensure it stays charged.   ACTIVITY  o Increase activity slowly as tolerated, but follow the weight bearing instructions below.   o No driving for 6 weeks or until further direction given by your physician.  You cannot drive while taking narcotics.  o No lifting or carrying greater than 10 lbs. until further directed by your surgeon. o Avoid periods of inactivity such as sitting longer than an hour when not asleep. This helps prevent blood clots.  o You may return to work once you are authorized by your doctor.     WEIGHT BEARING   Weight bearing as tolerated with assist device (walker, cane, etc) as directed, use it as long as suggested by your surgeon or therapist, typically at least 4-6 weeks.   EXERCISES  Results after joint replacement surgery are often greatly improved when you follow the exercise, range of motion and  muscle strengthening exercises prescribed by your doctor. Safety measures are also important to protect the joint from further injury. Any time any of these exercises cause you to have increased pain or swelling, decrease what you are doing until you are comfortable again and then slowly increase them. If you have problems or questions, call your caregiver or physical therapist for advice.   Rehabilitation is important following a joint replacement. After just a few days of immobilization, the muscles of the leg can become weakened and shrink (atrophy).  These exercises are designed to build up the tone and strength of the thigh and leg muscles and to improve motion. Often times heat used for twenty to thirty minutes before working out will loosen up your tissues and help with improving the range of motion but do not use heat for the first two weeks following surgery (sometimes heat can increase post-operative swelling).   These exercises can be done on a training (exercise) mat, on the floor, on a table or on a bed. Use whatever works the best and is most comfortable for you.    Use music or television while you are exercising so that the exercises are a pleasant break in your day. This will make your life better with the exercises acting as a break in your routine that you can look forward to.   Perform all exercises about fifteen times, three times per day or as directed.  You should exercise both the operative leg and the other leg as well.  Exercises include:    Quad Sets - Tighten up the muscle on the front of the thigh (Quad) and hold for 5-10 seconds.    Straight Leg Raises - With your knee straight (if you were given a brace, keep it on), lift the leg to 60 degrees, hold for 3 seconds, and slowly lower the leg.  Perform this exercise against resistance later as your leg gets stronger.   Leg Slides: Lying on your back, slowly slide your foot toward your buttocks, bending your knee up off the  floor (only go as far as is comfortable). Then slowly slide your foot back down until your leg is flat on the floor again.   Angel Wings: Lying on your back spread your legs to the side as far apart as you can without causing discomfort.   Hamstring Strength:  Lying on your back, push your heel against the floor with your leg straight by tightening up the muscles of your buttocks.  Repeat, but this time bend your knee to a comfortable angle, and push your heel against the floor.  You may put a pillow under the heel to make it more comfortable if necessary.   A rehabilitation program following joint replacement surgery can speed recovery and prevent re-injury in the future due to weakened muscles. Contact your doctor or a physical therapist for more information on knee rehabilitation.    CONSTIPATION  Constipation is defined medically as fewer than three stools per week and severe constipation as less than one stool per week.  Even if you have a regular bowel pattern at home, your normal regimen is likely to be disrupted due to multiple reasons following surgery.  Combination of anesthesia, postoperative narcotics, change in appetite and fluid intake all can affect your bowels.   YOU MUST use at least one of the following options; they are listed in order of increasing strength to get the job done.  They are all available over the counter, and you may need to use some, POSSIBLY even all of these options:    Drink plenty of fluids (prune juice may be helpful) and high fiber foods Colace 100 mg by mouth twice a day  Senokot for constipation as directed and as needed Dulcolax (bisacodyl), take with full glass of water  Miralax (polyethylene glycol) once or twice a day as needed.  If you have tried all these things and are unable to have a bowel movement in the first 3-4 days after surgery call either your surgeon or your primary doctor.    If you experience loose stools or diarrhea, hold the  medications until you stool forms back up.  If your symptoms do not get better within 1 week or if they get worse, check with your doctor.  If you experience "the worst abdominal pain ever" or develop nausea or vomiting, please contact the office immediately for further recommendations for treatment.   ITCHING:  If you experience itching with your medications, try taking only a single pain pill, or even half a pain pill at a time.  You can also use Benadryl over the counter for itching or also to help with sleep.   TED HOSE STOCKINGS:  Use stockings on both legs until for at least 2 weeks or as directed by physician office. They may be removed at night for sleeping.  MEDICATIONS:  See your medication summary on the After Visit Summary that  nursing will review with you.  You may have some home medications which will be placed on hold until you complete the course of blood thinner medication.  It is important for you to complete the blood thinner medication as prescribed.  PRECAUTIONS:  If you experience chest pain or shortness of breath - call 911 immediately for transfer to the hospital emergency department.   If you develop a fever greater that 101 F, purulent drainage from wound, increased redness or drainage from wound, foul odor from the wound/dressing, or calf pain - CONTACT YOUR SURGEON.                                                   FOLLOW-UP APPOINTMENTS:  If you do not already have a post-op appointment, please call the office for an appointment to be seen by your surgeon.  Guidelines for how soon to be seen are listed in your After Visit Summary, but are typically between 1-4 weeks after surgery.  OTHER INSTRUCTIONS:   Knee Replacement:  Do not place pillow under knee, focus on keeping the knee straight while resting. CPM instructions: 0-90 degrees, 2 hours in the morning, 2 hours in the afternoon, and 2 hours in the evening. Place foam block, curve side up under heel at all times  except when in CPM or when walking.  DO NOT modify, tear, cut, or change the foam block in any way.  MAKE SURE YOU:   Understand these instructions.   Get help right away if you are not doing well or get worse.    Thank you for letting us be a part of your medical care team.  It is a privilege we respect greatly.  We hope these instructions will help you stay on track for a fast and full recovery!

## 2018-09-18 NOTE — Op Note (Signed)
DATE OF SURGERY:  09/18/2018  TIME: 10:14 AM  PATIENT NAME:  Cheryl Fox    AGE: 38 y.o.    PRE-OPERATIVE DIAGNOSIS:  Osteoarthritis Right Knee  POST-OPERATIVE DIAGNOSIS:  Osteoarthritis Right Knee  PROCEDURE:  Procedure(s): RIGHT TOTAL KNEE ARTHROPLASTY Application Of Wound Vac  SURGEON: Aldean Baker  ASSISTANT: April Green  OPERATIVE IMPLANTS: Depuy , Posterior Stabilized.  Femur size 5, Tibia size 6, Patella size 35 3-peg oval button, with a 5 mm polyethylene insert.  Application of Praveena restore dressing  @ENCIMAGES @    PREOPERATIVE INDICATIONS:   Cheryl Fox is a 38 y.o. year old female with end stage degenerative arthritis of the knee who failed conservative treatment and elected for Total Knee Arthroplasty.   The risks, benefits, and alternatives were discussed at length including but not limited to the risks of infection, bleeding, nerve injury, stiffness, blood clots, the need for revision surgery, cardiopulmonary complications, among others, and they were willing to proceed.  OPERATIVE DESCRIPTION:  The patient was brought to the operative room and placed in a supine position.  General anesthesia was administered.  IV antibiotics were given.  The lower extremity was prepped and draped in the usual sterile fashion.  Cheryl Fox was used to cover all exposed skin. Time out was performed.    Anterior quadriceps tendon splitting approach was performed.  The patella was everted and osteophytes were removed.  The anterior horn of the medial and lateral meniscus was removed.   The distal femur was opened with the drill and the intramedullary distal femoral cutting jig was utilized, set at 5 degrees valgus resecting 9 mm off the distal femur.  Care was taken to protect the collateral ligaments.  Then the extramedullary tibial cutting jig was utilized set for 3 degree posterior slope.  Care was taken during the cut to protect the medial and collateral ligaments.  The  proximal tibia was removed along with the posterior horns of the menisci.  The PCL was sacrificed.    The extensor gap was measured and was approximately 5 mm.    The distal femoral sizing jig was applied, taking care to avoid notching.  Then the 4-in-1 cutting jig was applied and the anterior and posterior femur was cut, along with the chamfer cuts.  All posterior osteophytes were removed.  The flexion gap was then measured and was symmetric with the extension gap.  The distal femoral preparation using the appropriate jig to prepare the box.  The patella was then measured, and cut with the saw.    The proximal tibia sized and prepared accordingly with the reamer and the punch, and then all components were trialed with the poly insert.  The knee was found to have stable balance and full motion.  The knee was irrigated with normal saline and the knee was soaked with TXA.  The above named components were then cemented into place and all excess cement was removed.  The final polyethylene component was in place during cementation.  The knee was kept in extension until the cement hardened.  The knee was then taken through a range of motion and the patella tracked well and the knee irrigated copiously and the parapatellar and subcutaneous tissue closed with vicryl, and skin closed with staples..  A Praveena restore dressing was applied and patient  was taken to the PACU in stable  condition.  There were no complications.  Total tourniquet time was 0 minutes.

## 2018-09-18 NOTE — Progress Notes (Signed)
Pt and wife in room. Wife stated that it "is stupid for her" to take subutex, it will only block the pain medications. Pt stated that she "didn't want to say" the toradol helped the pain, "but I have found a comfortable position." Pt no longer crying after 2mg  dose of dilaudid and toradol. Pt stated that she does not want the subutex. Wants a not made for people to not even give it to her. Continue to monitor. Pt resting in bed with eyes closed at this time.

## 2018-09-18 NOTE — Anesthesia Postprocedure Evaluation (Signed)
Anesthesia Post Note  Patient: Cheryl Fox  Procedure(s) Performed: RIGHT TOTAL KNEE ARTHROPLASTY (Right Knee) Application Of Wound Vac (Right Knee)     Patient location during evaluation: PACU Anesthesia Type: Spinal and Regional Level of consciousness: oriented and awake and alert Pain management: pain level controlled Vital Signs Assessment: post-procedure vital signs reviewed and stable Respiratory status: spontaneous breathing and respiratory function stable Cardiovascular status: blood pressure returned to baseline and stable Postop Assessment: no headache, no backache, no apparent nausea or vomiting, spinal receding and patient able to bend at knees Anesthetic complications: no    Last Vitals:  Vitals:   09/18/18 1140 09/18/18 1158  BP:  (!) 132/93  Pulse: (!) 58 63  Resp: 16 18  Temp:  36.4 C  SpO2: 98% 100%    Last Pain:  Vitals:   09/18/18 1202  TempSrc:   PainSc: 8                  Duval Macleod,W. EDMOND

## 2018-09-18 NOTE — Anesthesia Procedure Notes (Signed)
Anesthesia Regional Block: Adductor canal block   Pre-Anesthetic Checklist: ,, timeout performed, Correct Patient, Correct Site, Correct Laterality, Correct Procedure, Correct Position, site marked, Risks and benefits discussed, pre-op evaluation,  At surgeon's request and post-op pain management  Laterality: Right  Prep: Maximum Sterile Barrier Precautions used, chloraprep       Needles:  Injection technique: Single-shot  Needle Type: Echogenic Stimulator Needle     Needle Length: 9cm  Needle Gauge: 21     Additional Needles:   Procedures:,,,, ultrasound used (permanent image in chart),,,,  Narrative:  Start time: 09/18/2018 7:57 AM End time: 09/18/2018 8:07 AM Injection made incrementally with aspirations every 5 mL.  Performed by: Personally  Anesthesiologist: Gaynelle Adu, MD  Additional Notes: 2% Lidocaine skin wheel.

## 2018-09-18 NOTE — Transfer of Care (Signed)
Immediate Anesthesia Transfer of Care Note  Patient: Cheryl Fox  Procedure(s) Performed: RIGHT TOTAL KNEE ARTHROPLASTY (Right Knee) Application Of Wound Vac (Right Knee)  Patient Location: PACU  Anesthesia Type:MAC, Regional and Spinal  Level of Consciousness: awake, alert  and oriented  Airway & Oxygen Therapy: Patient Spontanous Breathing  Post-op Assessment: Report given to RN and Post -op Vital signs reviewed and stable  Post vital signs: Reviewed and stable  Last Vitals:  Vitals Value Taken Time  BP 116/79   Temp    Pulse 68 09/18/2018 10:24 AM  Resp 13 09/18/2018 10:24 AM  SpO2 100 % 09/18/2018 10:24 AM  Vitals shown include unvalidated device data.  Last Pain:  Vitals:   09/18/18 0731  PainSc: 8          Complications: No apparent anesthesia complications

## 2018-09-18 NOTE — Addendum Note (Signed)
Addendum  created 09/18/18 1258 by Zollie Scale, CRNA   Intraprocedure Flowsheets edited

## 2018-09-18 NOTE — Progress Notes (Addendum)
Pt continues to have out of controlled pain. Pt was given 2 of dilaudid as ordered, but has has little effect on her pain. Pt stated that she has been taking 8 mg of suboxone every day up until yesterday. Called PA at Ortho office for more orders. Awaiting response

## 2018-09-18 NOTE — H&P (Signed)
TOTAL KNEE ADMISSION H&P  Patient is being admitted for right total knee arthroplasty.  Subjective:  Chief Complaint:right knee pain.  HPI: Cheryl Fox, 38 y.o. female, has a history of pain and functional disability in the right knee due to arthritis and has failed non-surgical conservative treatments for greater than 12 weeks to includeNSAID's and/or analgesics, corticosteriod injections and activity modification.  Onset of symptoms was gradual, starting 8 years ago with gradually worsening course since that time. The patient noted no past surgery on the right knee(s).  Patient currently rates pain in the right knee(s) at 8 out of 10 with activity. Patient has night pain, worsening of pain with activity and weight bearing, pain that interferes with activities of daily living, pain with passive range of motion, crepitus and joint swelling.  Patient has evidence of subchondral cysts, subchondral sclerosis, periarticular osteophytes, joint subluxation and joint space narrowing by imaging studies. This patient has had avascular necrosis of the knee. There is no active infection.  Patient Active Problem List   Diagnosis Date Noted  . Primary osteoarthritis of right knee 09/03/2018  . Pain in left wrist 07/16/2018  . Pain in both hands 12/20/2017  . Body mass index 40.0-44.9, adult (HCC) 12/20/2017  . Morbid obesity (HCC) 12/20/2017   Past Medical History:  Diagnosis Date  . Acid reflux   . Anxiety   . Arthritis   . Complication of anesthesia    after hysterectomy, BP was very high  . Constipation due to pain medication   . Depression   . Hepatitis C    treated with Maverick  . Hypertension     Past Surgical History:  Procedure Laterality Date  . ABDOMINAL HYSTERECTOMY    . CHOLECYSTECTOMY      Current Facility-Administered Medications  Medication Dose Route Frequency Provider Last Rate Last Dose  . clindamycin (CLEOCIN) IVPB 900 mg  900 mg Intravenous To SS-Surg Nadara Mustard, MD       . tranexamic acid (CYKLOKAPRON) 2,000 mg in sodium chloride 0.9 % 50 mL Topical Application  2,000 mg Topical To OR Nadara Mustard, MD      . tranexamic acid (CYKLOKAPRON) IVPB 1,000 mg  1,000 mg Intravenous To OR Nadara Mustard, MD       Allergies  Allergen Reactions  . Celecoxib Hives and Nausea Only  . Naproxen Other (See Comments) and Rash    Very bad headache   . Penicillins Hives, Nausea And Vomiting and Rash    Has patient had a PCN reaction causing immediate rash, facial/tongue/throat swelling, SOB or lightheadedness with hypotension: Unknown Has patient had a PCN reaction causing severe rash involving mucus membranes or skin necrosis: Unknown Has patient had a PCN reaction that required hospitalization: Unknown Has patient had a PCN reaction occurring within the last 10 years: Yes - has had  If all of the above answers are "NO", then may proceed with Cephalosporin use.   . Prednisone Hives, Nausea Only and Other (See Comments)    Makes her angry   . Tramadol Nausea And Vomiting and Other (See Comments)    Very bad headache   . Hydrocodone-Ibuprofen Nausea And Vomiting and Rash  . Motrin [Ibuprofen] Other (See Comments)    Stomach irritation    Social History   Tobacco Use  . Smoking status: Former Smoker    Packs/day: 0.50    Types: Cigarettes    Last attempt to quit: 09/06/2018    Years since quitting: 0.0  .  Smokeless tobacco: Never Used  Substance Use Topics  . Alcohol use: No    Comment: occasion    No family history on file.   ROS  Objective:  Physical Exam  Vital signs in last 24 hours: Temp:  [97.9 F (36.6 C)] 97.9 F (36.6 C) (03/11 0635) Pulse Rate:  [72] 72 (03/11 0635) Resp:  [20] 20 (03/11 0635) BP: (138)/(92) 138/92 (03/11 0635) SpO2:  [98 %] 98 % (03/11 0635) Weight:  [126.6 kg] 126.6 kg (03/11 0635)  Labs:   Estimated body mass index is 45.03 kg/m as calculated from the following:   Height as of this encounter: 5\' 6"  (1.676  m).   Weight as of this encounter: 126.6 kg.   Imaging Review Plain radiographs demonstrate moderate degenerative joint disease of the right knee(s). The overall alignment ismild varus. The bone quality appears to be adequate for age and reported activity level.      Assessment/Plan:  End stage arthritis, right knee   The patient history, physical examination, clinical judgment of the provider and imaging studies are consistent with end stage degenerative joint disease of the right knee(s) and total knee arthroplasty is deemed medically necessary. The treatment options including medical management, injection therapy arthroscopy and arthroplasty were discussed at length. The risks and benefits of total knee arthroplasty were presented and reviewed. The risks due to aseptic loosening, infection, stiffness, patella tracking problems, thromboembolic complications and other imponderables were discussed. The patient acknowledged the explanation, agreed to proceed with the plan and consent was signed. Patient is being admitted for inpatient treatment for surgery, pain control, PT, OT, prophylactic antibiotics, VTE prophylaxis, progressive ambulation and ADL's and discharge planning. The patient is planning to be discharged home with home health services     Patient's anticipated LOS is less than 2 midnights, meeting these requirements: - Younger than 9 - Lives within 1 hour of care - Has a competent adult at home to recover with post-op recover - NO history of  - Chronic pain requiring opiods  - Diabetes  - Coronary Artery Disease  - Heart failure  - Heart attack  - Stroke  - DVT/VTE  - Cardiac arrhythmia  - Respiratory Failure/COPD  - Renal failure  - Anemia  - Advanced Liver disease

## 2018-09-19 ENCOUNTER — Other Ambulatory Visit: Payer: Self-pay

## 2018-09-19 ENCOUNTER — Encounter (HOSPITAL_COMMUNITY): Payer: Self-pay | Admitting: Orthopedic Surgery

## 2018-09-19 DIAGNOSIS — Z87891 Personal history of nicotine dependence: Secondary | ICD-10-CM | POA: Diagnosis not present

## 2018-09-19 DIAGNOSIS — M1711 Unilateral primary osteoarthritis, right knee: Secondary | ICD-10-CM | POA: Diagnosis present

## 2018-09-19 DIAGNOSIS — Z886 Allergy status to analgesic agent status: Secondary | ICD-10-CM | POA: Diagnosis not present

## 2018-09-19 DIAGNOSIS — M879 Osteonecrosis, unspecified: Secondary | ICD-10-CM | POA: Diagnosis present

## 2018-09-19 DIAGNOSIS — M25761 Osteophyte, right knee: Secondary | ICD-10-CM | POA: Diagnosis present

## 2018-09-19 DIAGNOSIS — Z6841 Body Mass Index (BMI) 40.0 and over, adult: Secondary | ICD-10-CM | POA: Diagnosis not present

## 2018-09-19 DIAGNOSIS — Z888 Allergy status to other drugs, medicaments and biological substances status: Secondary | ICD-10-CM | POA: Diagnosis not present

## 2018-09-19 DIAGNOSIS — I10 Essential (primary) hypertension: Secondary | ICD-10-CM | POA: Diagnosis present

## 2018-09-19 DIAGNOSIS — M25561 Pain in right knee: Secondary | ICD-10-CM | POA: Diagnosis present

## 2018-09-19 DIAGNOSIS — Z90710 Acquired absence of both cervix and uterus: Secondary | ICD-10-CM | POA: Diagnosis not present

## 2018-09-19 DIAGNOSIS — Z885 Allergy status to narcotic agent status: Secondary | ICD-10-CM | POA: Diagnosis not present

## 2018-09-19 DIAGNOSIS — Z880 Allergy status to penicillin: Secondary | ICD-10-CM | POA: Diagnosis not present

## 2018-09-19 DIAGNOSIS — K219 Gastro-esophageal reflux disease without esophagitis: Secondary | ICD-10-CM | POA: Diagnosis present

## 2018-09-19 DIAGNOSIS — Z9049 Acquired absence of other specified parts of digestive tract: Secondary | ICD-10-CM | POA: Diagnosis not present

## 2018-09-19 MED ORDER — METHOCARBAMOL 500 MG PO TABS
500.0000 mg | ORAL_TABLET | Freq: Four times a day (QID) | ORAL | Status: DC | PRN
  Administered 2018-09-19 – 2018-09-20 (×2): 500 mg via ORAL
  Filled 2018-09-19 (×2): qty 1

## 2018-09-19 MED ORDER — ZOLPIDEM TARTRATE 5 MG PO TABS
5.0000 mg | ORAL_TABLET | Freq: Every day | ORAL | Status: DC
  Administered 2018-09-19: 5 mg via ORAL
  Filled 2018-09-19: qty 1

## 2018-09-19 MED ORDER — KETOROLAC TROMETHAMINE 30 MG/ML IJ SOLN: 30.0000 mg | Freq: Four times a day (QID) | INTRAMUSCULAR | Status: DC | PRN

## 2018-09-19 MED ORDER — GABAPENTIN 400 MG PO CAPS
800.0000 mg | ORAL_CAPSULE | Freq: Three times a day (TID) | ORAL | Status: DC
  Administered 2018-09-19 – 2018-09-20 (×3): 800 mg via ORAL
  Filled 2018-09-19 (×3): qty 2

## 2018-09-19 MED ORDER — KETOROLAC TROMETHAMINE 30 MG/ML IJ SOLN
30.0000 mg | Freq: Four times a day (QID) | INTRAMUSCULAR | Status: DC | PRN
  Administered 2018-09-19: 30 mg via INTRAVENOUS

## 2018-09-19 NOTE — Progress Notes (Signed)
Patient ID: Cheryl Fox, female   DOB: 07/05/1981, 38 y.o.   MRN: [id number] Patient is postoperative day 1 total knee arthroplasty.  Discussed the importance of knee extension exercises there is no drainage in the wound VAC canister there are 2 checkmarks.  Anticipate patient can be discharged to home tomorrow.  Pain medications were adjusted today.

## 2018-09-19 NOTE — Progress Notes (Signed)
Physical Therapy Treatment Patient Details Name: Cheryl Fox MRN: [id number] DOB: [birth year] Today's Date: 09/19/2018    History of Present Illness Patient is 38 y/o female s/p R TKA. PMH includes morbid obesity, hep C, HTN, and anxiety.     PT Comments    Pt received in bed, willing to participate in therapy. Pt ambulated to the bathroom with min guard; verbal cues for hand placement for toilet transfer. Pt expresses concern about toileting at home; educated that [facility] has rails for her to use as needed at home. Performed exercises. Functional mobility is progressing (see details below), but pt is still limited by her pain. She will continue to benefit from skilled acute PT services to continue to safely progress functional mobility and allow for safe DC.    Follow Up Recommendations  Follow surgeon's recommendation for DC plan and follow-up therapies     Equipment Recommendations  Rolling walker with 5" wheels;3in1 (PT)    Recommendations for Other Services       Precautions / Restrictions Precautions Precautions: Fall;Knee Precaution Booklet Issued: Yes (comment) Precaution Comments: reviewed knee precautions with patient Restrictions Weight Bearing Restrictions: Yes RLE Weight Bearing: Weight bearing as tolerated    Mobility  Bed Mobility Overal bed mobility: Needs Assistance Bed Mobility: Supine to Sit;Sit to Supine     Supine to sit: Min assist Sit to supine: Min assist   General bed mobility comments: MinA for management of R LE  Transfers Overall transfer level: Needs assistance Equipment used: Rolling walker (2 wheeled) Transfers: Sit to/from Stand Sit to Stand: Min guard   Squat pivot transfers: Min guard     General transfer comment: Min guard for safety during sit to stand, as pt tends to lean forward over RW and take extra time to stand upright  Ambulation/Gait Ambulation/Gait assistance: Min guard Gait Distance (Feet): 15 Feet(x2) Assistive  device: Rolling walker (2 wheeled) Gait Pattern/deviations: Step-to pattern;Antalgic;Decreased stance time - right;Decreased step length - left;Decreased dorsiflexion - right Gait velocity: decreased    General Gait Details: Pt ambulted to bathroom and back to bed with antalgic gait pattern, min guard with RW; relies heavily on RW for support   Stairs             Wheelchair Mobility    Modified Rankin (Stroke Patients Only)       Balance Overall balance assessment: Needs assistance Sitting-balance support: Bilateral upper extremity supported;Feet supported Sitting balance-Leahy Scale: Fair Sitting balance - Comments: Able to sit EOB without assistance   Standing balance support: Bilateral upper extremity supported;During functional activity Standing balance-Leahy Scale: Poor Standing balance comment: heavy reliance on RW for bil UE support                            Cognition Arousal/Alertness: Awake/alert Behavior During Therapy: WFL for tasks assessed/performed Overall Cognitive Status: Within Functional Limits for tasks assessed                                        Exercises Total Joint Exercises Ankle Circles/Pumps: AROM;10 reps;Supine Quad Sets: AROM;Right;5 reps;Supine Towel Squeeze: AROM;Both;10 reps;Supine Heel Slides: AROM;Right;10 reps;Seated Hip ABduction/ADduction: AAROM;10 reps;Left;Supine(2 AROM with max effort, increased pain) Goniometric ROM: 65 degrees flexion; lacking 16 degrees extension    General Comments        Pertinent Vitals/Pain Pain Assessment: 0-10 Pain Score:  8  Pain Location: R knee Pain Descriptors / Indicators: Aching;Sore;Operative site guarding;Guarding Pain Intervention(s): Monitored during session;Repositioned;Patient requesting pain meds-RN notified    Home Living Family/patient expects to be discharged to:: Private residence Living Arrangements: Spouse/significant other Available Help at  Discharge: Family;Available 24 hours/day Type of Home: Mobile home Home Access: Stairs to enter Entrance Stairs-Rails: Right;Left;Can reach both Home Layout: One level Home Equipment: None      Prior Function Level of Independence: Independent          PT Goals (current goals can now be found in the care plan section) Acute Rehab PT Goals Patient Stated Goal: "be in less pain" PT Goal Formulation: With patient Time For Goal Achievement: 10/02/18 Potential to Achieve Goals: Good Progress towards PT goals: Progressing toward goals    Frequency    7X/week      PT Plan Current plan remains appropriate    Co-evaluation              AM-PAC PT "6 Clicks" Mobility   Outcome Measure  Help needed turning from your back to your side while in a flat bed without using bedrails?: None Help needed moving from lying on your back to sitting on the side of a flat bed without using bedrails?: A Little Help needed moving to and from a bed to a chair (including a wheelchair)?: A Little Help needed standing up from a chair using your arms (e.g., wheelchair or bedside chair)?: A Little Help needed to walk in hospital room?: A Little Help needed climbing 3-5 steps with a railing? : A Lot 6 Click Score: 18    End of Session Equipment Utilized During Treatment: Gait belt Activity Tolerance: Patient limited by pain Patient left: in bed;with call bell/phone within reach Nurse Communication: Mobility status;Patient requests pain meds PT Visit Diagnosis: Other abnormalities of gait and mobility (R26.89);Muscle weakness (generalized) (M62.81);Difficulty in walking, not elsewhere classified (R26.2);Pain Pain - Right/Left: Right Pain - part of body: Knee                          Halina Andreas, SPT    Halina Andreas 09/19/2018, 2:19 PM

## 2018-09-19 NOTE — Progress Notes (Signed)
In to give pt medication. attempted to give IV dilaudid, IV noted to be leaking. IV removed. Attempted to locate new site. Unable to do so. IV team consult placed. Continue to monitor.

## 2018-09-19 NOTE — Progress Notes (Signed)
Called and spoke with MD regarding loss of pts IV access. New orders received, clarified dose with pharmacy on IM toradol. Pt educated, but disappointed with the circumstances. Pt continues to refuse subutex, stating that it blocks the effects of her pain meds. Continue to monitor.

## 2018-09-19 NOTE — Evaluation (Signed)
Occupational Therapy Evaluation Patient Details Name: Cheryl Fox MRN: [id number] DOB: 03/31/1981 Today's Date: 09/19/2018    History of Present Illness Patient is 38 y/o female s/p R TKA. PMH includes morbid obesity, hep C, HTN, and anxiety.    Clinical Impression    Pt with decline in function and safety with ADLs and ADL mobility with decreased balance and endurance. PTA, pt lived at home with spouse and 81 year old grandson and was independent with ADLs/selfcare and mobility. Pt would benefit from acute OT services to address impairments to maximize level of function and safety         Follow Up Recommendations  Follow surgeon's recommendation for DC plan and follow-up therapies    Equipment Recommendations  3 in 1 bedside commode;Tub/shower bench    Recommendations for Other Services       Precautions / Restrictions Precautions Precautions: Fall;Knee Precaution Booklet Issued: Yes (comment) Precaution Comments: reviewed knee precautions with patient Restrictions Weight Bearing Restrictions: Yes RLE Weight Bearing: Weight bearing as tolerated      Mobility Bed Mobility Overal bed mobility: Needs Assistance Bed Mobility: Supine to Sit;Sit to Supine     Supine to sit: Min assist Sit to supine: Min assist   General bed mobility comments: Min A with R LE  Transfers Overall transfer level: Needs assistance Equipment used: None;Rolling walker (2 wheeled) Transfers: Sit to/from Stand Sit to Stand: Min guard   Squat pivot transfers: Min guard     General transfer comment: much improved mobility today, Min guard and extended time for all sit to stand transfers, cues for safety     Balance Overall balance assessment: Needs assistance Sitting-balance support: Bilateral upper extremity supported;Feet supported Sitting balance-Leahy Scale: Good     Standing balance support: Bilateral upper extremity supported;During functional activity Standing balance-Leahy  Scale: Poor Standing balance comment: heavy reliance on B UE support                            ADL either performed or assessed with clinical judgement   ADL Overall ADL's : Needs assistance/impaired     Grooming: Wash/dry hands;Wash/dry face;Standing;Min guard   Upper Body Bathing: Set up;Sitting   Lower Body Bathing: Moderate assistance;Sitting/lateral leans;Sit to/from stand   Upper Body Dressing : Set up;Sitting   Lower Body Dressing: Moderate assistance;Sitting/lateral leans;Sit to/from stand   Toilet Transfer: Min guard;Ambulation;RW;BSC   Toileting- Clothing Manipulation and Hygiene: Minimal assistance;Sit to/from Nurse, children's Details (indicate cue type and reason): pt has a tub shower at home, will focus on next session with tub bench or 3 in 1 Functional mobility during ADLs: Min guard;Rolling walker       Vision Baseline Vision/History: Wears glasses Wears Glasses: At all times Patient Visual Report: No change from baseline       Perception     Praxis      Pertinent Vitals/Pain Pain Assessment: 0-10 Pain Score: 7  Pain Location: R knee Pain Descriptors / Indicators: Aching;Sore;Operative site guarding;Guarding Pain Intervention(s): Monitored during session;Premedicated before session;Repositioned     Hand Dominance Right   Extremity/Trunk Assessment Upper Extremity Assessment Upper Extremity Assessment: Overall WFL for tasks assessed   Lower Extremity Assessment Lower Extremity Assessment: Defer to PT evaluation   Cervical / Trunk Assessment Cervical / Trunk Assessment: Normal   Communication Communication Communication: No difficulties   Cognition Arousal/Alertness: Awake/alert Behavior During Therapy: WFL for tasks assessed/performed Overall Cognitive Status:  Within Functional Limits for tasks assessed                                     General Comments       Exercises Total Joint  Exercises Ankle Circles/Pumps: AROM;10 reps;Seated Quad Sets: AROM;Right;5 reps;Seated Heel Slides: AROM;Right;10 reps;Seated   Shoulder Instructions      Home Living Family/patient expects to be discharged to:: Private residence Living Arrangements: Spouse/significant other Available Help at Discharge: Family;Available 24 hours/day Type of Home: Mobile home Home Access: Stairs to enter   Entrance Stairs-Rails: Right;Left;Can reach both Home Layout: One level     Bathroom Shower/Tub: Chief Strategy Officer: Standard     Home Equipment: None          Prior Functioning/Environment Level of Independence: Independent                 OT Problem List: Decreased activity tolerance;Decreased knowledge of use of DME or AE;Obesity;Pain;Impaired balance (sitting and/or standing)      OT Treatment/Interventions: Self-care/ADL training;DME and/or AE instruction;Therapeutic activities;Patient/family education    OT Goals(Current goals can be found in the care plan section) Acute Rehab OT Goals Patient Stated Goal: "be in less pain" OT Goal Formulation: With patient Time For Goal Achievement: 10/03/18 Potential to Achieve Goals: Good ADL Goals Pt Will Perform Grooming: with supervision;with set-up;with modified independence;standing Pt Will Perform Lower Body Bathing: with min assist;with min guard assist;with caregiver independent in assisting;with adaptive equipment Pt Will Perform Lower Body Dressing: with min assist;with min guard assist;with caregiver independent in assisting;with adaptive equipment Pt Will Transfer to Toilet: with supervision;with modified independence;ambulating Pt Will Perform Toileting - Clothing Manipulation and hygiene: with min guard assist;sit to/from stand Pt Will Perform Tub/Shower Transfer: with supervision;ambulating;rolling walker;tub bench;3 in 1  OT Frequency: Min 2X/week   Barriers to D/C:    no barriers        Co-evaluation              AM-PAC OT "6 Clicks" Daily Activity     Outcome Measure Help from another person eating meals?: None Help from another person taking care of personal grooming?: A Little Help from another person toileting, which includes using toliet, bedpan, or urinal?: A Little Help from another person bathing (including washing, rinsing, drying)?: A Lot Help from another person to put on and taking off regular upper body clothing?: A Little Help from another person to put on and taking off regular lower body clothing?: A Lot 6 Click Score: 17   End of Session Equipment Utilized During Treatment: Gait belt;Rolling walker;Oxygen(BSC)  Activity Tolerance: Patient tolerated treatment well Patient left: in bed;with call bell/phone within reach;with nursing/sitter in room  OT Visit Diagnosis: Other abnormalities of gait and mobility (R26.89);Pain Pain - Right/Left: Right Pain - part of body: Knee                Time: 9147-8295 OT Time Calculation (min): 31 min Charges:  OT General Charges $OT Visit: 1 Visit OT Evaluation $OT Eval Low Complexity: 1 Low OT Treatments $Self Care/Home Management : 8-22 mins    Galen Manila 09/19/2018, 12:29 PM

## 2018-09-19 NOTE — Progress Notes (Signed)
Physical Therapy Treatment Patient Details Name: Cheryl Fox MRN: [id number] DOB: 02-17-1981 Today's Date: 09/19/2018    History of Present Illness Patient is 38 y/o female s/p R TKA. PMH includes morbid obesity, hep C, HTN, and anxiety.     PT Comments    Patient received in bed, continues to report high pain levels but willing to participate in PT. Able to perform bed mobility with MinA to support painful R LE, then able to perform all functional transfers with Min guard and RW, extended time and VC for safety today. Tolerated gait training approximately 43f in room with RW, antalgic step to pattern with poor tolerance for WB R LE but mobility improving quite a bit today nonetheless. She was left up in the chair with all needs met, chair alarm active this morning.     Follow Up Recommendations  Follow surgeon's recommendation for DC plan and follow-up therapies     Equipment Recommendations  Rolling walker with 5" wheels;3in1 (PT)    Recommendations for Other Services       Precautions / Restrictions Precautions Precautions: Fall;Knee Precaution Booklet Issued: Yes (comment) Precaution Comments: reviewed knee precautions with patient Restrictions Weight Bearing Restrictions: Yes RLE Weight Bearing: Weight bearing as tolerated    Mobility  Bed Mobility Overal bed mobility: Needs Assistance       Supine to sit: Min assist     General bed mobility comments: MinA to support R LE during mobility   Transfers Overall transfer level: Needs assistance   Transfers: Sit to/from Stand Sit to Stand: Min guard         General transfer comment: much improved mobility today, Min guard and extended time for all sit to stand transfers, cues for safety   Ambulation/Gait Ambulation/Gait assistance: Min guard Gait Distance (Feet): 35 Feet Assistive device: Rolling walker (2 wheeled) Gait Pattern/deviations: Step-to pattern;Decreased stance time - right;Decreased dorsiflexion  - right;Decreased step length - left;Antalgic Gait velocity: decreased    General Gait Details: antalgic pattern with reduced WB through RLE and excessive UE support on RW, gait in room with multiple turns and limited by pain    Stairs             Wheelchair Mobility    Modified Rankin (Stroke Patients Only)       Balance Overall balance assessment: Needs assistance Sitting-balance support: Bilateral upper extremity supported;Feet supported Sitting balance-Leahy Scale: Good     Standing balance support: Bilateral upper extremity supported;During functional activity Standing balance-Leahy Scale: Poor Standing balance comment: heavy reliance on B UE support                             Cognition Arousal/Alertness: Awake/alert Behavior During Therapy: WFL for tasks assessed/performed Overall Cognitive Status: Within Functional Limits for tasks assessed                                        Exercises Total Joint Exercises Ankle Circles/Pumps: AROM;10 reps;Seated Quad Sets: AROM;Right;5 reps;Seated Heel Slides: AROM;Right;10 reps;Seated    General Comments        Pertinent Vitals/Pain Pain Assessment: No/denies pain Pain Score: 9  Pain Location: R knee Pain Descriptors / Indicators: Aching;Sore;Operative site guarding;Guarding Pain Intervention(s): Limited activity within patient's tolerance;Monitored during session;Premedicated before session    Home Living  Prior Function            PT Goals (current goals can now be found in the care plan section) Acute Rehab PT Goals Patient Stated Goal: "be in less pain" PT Goal Formulation: With patient Time For Goal Achievement: 10/02/18 Potential to Achieve Goals: Good Progress towards PT goals: Progressing toward goals    Frequency    7X/week      PT Plan Current plan remains appropriate    Co-evaluation              AM-PAC PT "6  Clicks" Mobility   Outcome Measure  Help needed turning from your back to your side while in a flat bed without using bedrails?: None Help needed moving from lying on your back to sitting on the side of a flat bed without using bedrails?: A Little Help needed moving to and from a bed to a chair (including a wheelchair)?: A Little Help needed standing up from a chair using your arms (e.g., wheelchair or bedside chair)?: A Little Help needed to walk in hospital room?: A Little Help needed climbing 3-5 steps with a railing? : A Lot 6 Click Score: 18    End of Session Equipment Utilized During Treatment: Gait belt Activity Tolerance: Patient limited by pain Patient left: in chair;with chair alarm set;with call bell/phone within reach   PT Visit Diagnosis: Other abnormalities of gait and mobility (R26.89);Muscle weakness (generalized) (M62.81);Difficulty in walking, not elsewhere classified (R26.2);Pain Pain - Right/Left: Right Pain - part of body: Knee     Time: 6606-3016 PT Time Calculation (min) (ACUTE ONLY): 20 min  Charges:  $Gait Training: 8-22 mins                     Deniece Ree PT, DPT, CBIS  Supplemental Physical Therapist [place]    Pager [phone] Acute Rehab Office [phone]

## 2018-09-19 NOTE — Progress Notes (Signed)
Pt just had midline catheter placed for IV site. In to check pt and assess pain level. Pt stated that the way we have been doing medications this am has been working, that her pain is at a 7, much more tolerable than this am. Pt seemed to be calmed and relaxed. No acute distress noted. Continue to  Alternate pain medications to improve comfort as needed.

## 2018-09-20 MED ORDER — OXYCODONE-ACETAMINOPHEN 10-325 MG PO TABS: 1.0000 | ORAL_TABLET | ORAL | 0 refills | Status: DC | PRN

## 2018-09-20 MED ORDER — ASPIRIN 325 MG PO TBEC: 325.0000 mg | DELAYED_RELEASE_TABLET | Freq: Every day | ORAL | 0 refills | Status: DC

## 2018-09-20 MED ORDER — METHOCARBAMOL 500 MG PO TABS: 500.0000 mg | ORAL_TABLET | Freq: Four times a day (QID) | ORAL | 2 refills | Status: DC | PRN

## 2018-09-20 MED ORDER — DIPHENHYDRAMINE HCL 12.5 MG/5ML PO ELIX: 12.5000 mg | ORAL_SOLUTION | ORAL | 0 refills | Status: DC | PRN

## 2018-09-20 MED ORDER — DOCUSATE SODIUM 100 MG PO CAPS: 100.0000 mg | ORAL_CAPSULE | Freq: Two times a day (BID) | ORAL | 0 refills | Status: DC

## 2018-09-20 MED ORDER — POLYETHYLENE GLYCOL 3350 17 G PO PACK: 17.0000 g | PACK | Freq: Every day | ORAL | 0 refills | Status: DC | PRN

## 2018-09-20 MED ORDER — OXYCODONE-ACETAMINOPHEN 5-325 MG PO TABS: 1.0000 | ORAL_TABLET | ORAL | 0 refills | Status: DC | PRN

## 2018-09-20 NOTE — Progress Notes (Signed)
Physical Therapy Treatment Patient Details Name: Cheryl Fox MRN: [id number] DOB: 03-Jun-1981 Today's Date: 09/20/2018    History of Present Illness Patient is 38 y/o female s/p R TKA. PMH includes morbid obesity, hep C, HTN, and anxiety.     PT Comments    Patient reports 8/10 pain since OT session. Patient agrees to PT session. Requires min assist with right LE moving it off bed and down to floor. Requires supervision for sit to stand transfer, cues for safety. Patient ambulated with RW 60 feet and then was fatigued requesting wheelchair assist the rest of the way to the steps. Patient ambulated with single rail on right sideways up/down 6 steps with supervision and cues for sequencing. Fatigued with this, but safe.  Performed seated and supine right LE exercises with assist due to pain/weakness. Patient will benefit from continued skilled PT to address her limited activity tolerance, pain and decreased strength/rom of Right LE.      Follow Up Recommendations  Follow surgeon's recommendation for DC plan and follow-up therapies     Equipment Recommendations  Rolling walker with 5" wheels;3in1 (PT)    Recommendations for Other Services       Precautions / Restrictions Precautions Precautions: Fall;Knee Precaution Booklet Issued: Yes (comment) Precaution Comments: reviewed knee precautions with patient Restrictions Weight Bearing Restrictions: Yes RLE Weight Bearing: Weight bearing as tolerated    Mobility  Bed Mobility Overal bed mobility: Needs Assistance(Simultaneous filing. User may not have seen previous data.) Bed Mobility: Supine to Sit;Sit to Supine(Simultaneous filing. User may not have seen previous data.)     Supine to sit: Min assist(Simultaneous filing. User may not have seen previous data.) Sit to supine: Min assist(Simultaneous filing. User may not have seen previous data.)   General bed mobility comments: MinA for management of R LE(Simultaneous filing. User  may not have seen previous data.)  Transfers Overall transfer level: Needs assistance(Simultaneous filing. User may not have seen previous data.) Equipment used: Rolling walker (2 wheeled)(Simultaneous filing. User may not have seen previous data.) Transfers: Sit to/from Stand(Simultaneous filing. User may not have seen previous data.) Sit to Stand: Min guard(Simultaneous filing. User may not have seen previous data.)         General transfer comment: Min guard for safety during sit to stand, as pt tends to lean forward over RW and take extra time to stand upright. VCs for sequencing and safety.(Simultaneous filing. User may not have seen previous data.)  Ambulation/Gait Ambulation/Gait assistance: Min guard Gait Distance (Feet): 60 Feet Assistive device: Rolling walker (2 wheeled) Gait Pattern/deviations: Step-to pattern;Antalgic;Decreased stance time - right;Trunk flexed Gait velocity: decreased   General Gait Details: Patient relies heavily on RW.    Stairs Stairs: Yes Stairs assistance: Supervision Stair Management: One rail Right;Step to pattern;Sideways Number of Stairs: 6 General stair comments: good ability/safety on steps, fatigued    Wheelchair Mobility    Modified Rankin (Stroke Patients Only)       Balance Overall balance assessment: Modified Independent Sitting-balance support: Bilateral upper extremity supported;Feet supported Sitting balance-Leahy Scale: Fair Sitting balance - Comments: Able to sit EOB without assistance   Standing balance support: Bilateral upper extremity supported;During functional activity Standing balance-Leahy Scale: Fair Standing balance comment: heavy reliance on RW for bil UE support                            Cognition Arousal/Alertness: Awake/alert Behavior During Therapy: WFL for tasks assessed/performed Overall Cognitive  Status: Within Functional Limits for tasks assessed                                         Exercises Total Joint Exercises Ankle Circles/Pumps: AROM;10 reps;Supine;Right Quad Sets: AROM;10 reps;Supine;Right Heel Slides: AROM;10 reps;Supine;Right Hip ABduction/ADduction: AAROM;10 reps;Right;Supine Straight Leg Raises: AAROM;10 reps;Right;Supine Long Arc Quad: AAROM;10 reps;Right;Seated Goniometric ROM: 10-70    General Comments        Pertinent Vitals/Pain Pain Assessment: 0-10 Pain Score: 8  Pain Location: R knee Pain Descriptors / Indicators: Aching;Sore;Operative site guarding;Discomfort Pain Intervention(s): Limited activity within patient's tolerance;Monitored during session;Repositioned;Premedicated before session    Home Living                      Prior Function            PT Goals (current goals can now be found in the care plan section) Acute Rehab PT Goals Patient Stated Goal: "be in less pain" PT Goal Formulation: With patient Time For Goal Achievement: 10/02/18 Potential to Achieve Goals: Good Progress towards PT goals: Progressing toward goals    Frequency    7X/week      PT Plan Current plan remains appropriate    Co-evaluation              AM-PAC PT "6 Clicks" Mobility   Outcome Measure  Help needed turning from your back to your side while in a flat bed without using bedrails?: A Little Help needed moving from lying on your back to sitting on the side of a flat bed without using bedrails?: A Little Help needed moving to and from a bed to a chair (including a wheelchair)?: A Little Help needed standing up from a chair using your arms (e.g., wheelchair or bedside chair)?: A Little Help needed to walk in hospital room?: A Little Help needed climbing 3-5 steps with a railing? : A Little 6 Click Score: 18    End of Session Equipment Utilized During Treatment: Gait belt(wound vac) Activity Tolerance: Patient limited by fatigue;Patient limited by pain Patient left: in bed;with call bell/phone within  reach Nurse Communication: Mobility status PT Visit Diagnosis: Muscle weakness (generalized) (M62.81);Difficulty in walking, not elsewhere classified (R26.2);Pain Pain - Right/Left: Right Pain - part of body: Knee     Time: 1000-1030 PT Time Calculation (min) (ACUTE ONLY): 30 min  Charges:  $Gait Training: 8-22 mins $Therapeutic Exercise: 8-22 mins                     Chalise Pe, PT, GCS 09/20/18,11:12 AM

## 2018-09-20 NOTE — Plan of Care (Signed)

## 2018-09-20 NOTE — Discharge Summary (Addendum)
Discharge Diagnoses:  Active Problems:   Unilateral primary osteoarthritis, right knee   Total knee replacement status, right   Surgeries: Procedure(s): RIGHT TOTAL KNEE ARTHROPLASTY Application Of Wound Vac on 09/18/2018    Consultants:   Discharged Condition: Improved  Hospital Course: Cheryl Fox is an 38 y.o. female who was admitted 09/18/2018 with a chief complaint of right knee pain/severe osteoarthritis, with a final diagnosis of Osteoarthritis Right Knee.  Patient was brought to the operating room on 09/18/2018 and underwent Procedure(s): RIGHT TOTAL KNEE ARTHROPLASTY Application Of Wound Vac.    Patient was given perioperative antibiotics:  Anti-infectives (From admission, onward)   Start     Dose/Rate Route Frequency Ordered Stop   09/18/18 1400  clindamycin (CLEOCIN) IVPB 600 mg     600 mg 100 mL/hr over 30 Minutes Intravenous Every 6 hours 09/18/18 1158 09/18/18 2004   09/18/18 0730  clindamycin (CLEOCIN) IVPB 900 mg     900 mg 100 mL/hr over 30 Minutes Intravenous To [facility]Stay [facility] 09/17/18 0956 09/18/18 0900    .  Patient was given sequential compression devices, early ambulation, and aspirin for DVT prophylaxis.  Recent vital signs:  Patient Vitals for the past 24 hrs:  BP Temp Temp src Pulse Resp SpO2  09/20/18 1243 103/63 98.4 F (36.9 C) Oral 100 16 96 %  09/20/18 0446 139/84 99.3 F (37.4 C) - 93 16 100 %  09/19/18 2040 126/78 98.7 F (37.1 C) Oral (!) 102 18 96 %  09/19/18 1428 124/74 98.3 F (36.8 C) Oral (!) 105 17 97 %  .  Recent laboratory studies: No results found.  Discharge Medications:   Allergies as of 09/20/2018      Reactions   Celecoxib Hives, Nausea Only   Naproxen Other (See Comments), Rash   Very bad headache    Penicillins Hives, Nausea And Vomiting, Rash   Has patient had a PCN reaction causing immediate rash, facial/tongue/throat swelling, SOB or lightheadedness with hypotension: Unknown Has patient had a PCN reaction  causing severe rash involving mucus membranes or skin necrosis: Unknown Has patient had a PCN reaction that required hospitalization: Unknown Has patient had a PCN reaction occurring within the last 10 years: Yes - has had  If all of the above answers are "NO", then may proceed with Cephalosporin use.   Prednisone Hives, Nausea Only, Other (See Comments)   Makes her angry    Tramadol Nausea And Vomiting, Other (See Comments)   Very bad headache    Hydrocodone-ibuprofen Nausea And Vomiting, Rash   Motrin [ibuprofen] Other (See Comments)   Stomach irritation      Medication List    STOP taking these medications   HYDROcodone-acetaminophen 5-325 MG tablet Commonly known as:  NORCO/VICODIN   Suboxone 8-2 MG Film Generic drug:  Buprenorphine HCl-Naloxone HCl     TAKE these medications   aspirin 325 MG EC tablet Take 1 tablet (325 mg total) by mouth daily with breakfast.   diphenhydrAMINE 12.5 MG/5ML elixir Commonly known as:  BENADRYL Take 5-10 mLs (12.5-25 mg total) by mouth every 4 (four) hours as needed for itching.   docusate sodium 100 MG capsule Commonly known as:  COLACE Take 1 capsule (100 mg total) by mouth 2 (two) times daily.   gabapentin 800 MG tablet Commonly known as:  NEURONTIN Take 800 mg by mouth 2 (two) times daily.   lamoTRIgine 25 MG tablet Commonly known as:  LAMICTAL Take 25 mg by mouth daily.   Linzess 145  MCG Caps capsule Generic drug:  linaclotide Take 145 mcg by mouth daily as needed for constipation.   lurasidone 40 MG Tabs tablet Commonly known as:  LATUDA Take 40 mg by mouth daily with breakfast.   meloxicam 15 MG tablet Commonly known as:  MOBIC Take 15 mg by mouth daily.   methocarbamol 500 MG tablet Commonly known as:  ROBAXIN Take 1 tablet (500 mg total) by mouth every 6 (six) hours as needed for muscle spasms.   omeprazole 40 MG capsule Commonly known as:  PRILOSEC Take 40 mg by mouth daily.   ondansetron 4 MG  tablet Commonly known as:  ZOFRAN Take 4 mg by mouth every 12 (twelve) hours as needed for nausea/vomiting.   oxyCODONE-acetaminophen 10-325 MG tablet Commonly known as:  Percocet Take 1 tablet by mouth every 4 (four) hours as needed for pain.   oxyCODONE-acetaminophen 5-325 MG tablet Commonly known as:  PERCOCET/ROXICET Take 1 tablet by mouth every 4 (four) hours as needed for moderate pain or severe pain.   polyethylene glycol packet Commonly known as:  MIRALAX / GLYCOLAX Take 17 g by mouth daily as needed for mild constipation.   zolpidem 10 MG tablet Commonly known as:  AMBIEN Take 10 mg by mouth at bedtime as needed for sleep.       Diagnostic Studies: No results found.  Patient benefited maximally from their hospital stay and there were no complications.     Disposition: Discharge disposition: 01-Home or Self Care      Discharge Instructions    Call MD / Call 911   Complete by:  As directed    If you experience chest pain or shortness of breath, CALL 911 and be transported to the hospital emergency room.  If you develope a fever above 101 F, pus (white drainage) or increased drainage or redness at the wound, or calf pain, call your surgeon's office.   Care order/instruction:   Complete by:  As directed    Place Prevena VAC at discharge and instruct patient to keep plugged into wall outlet at home as much as possible to keep charged.   Constipation Prevention   Complete by:  As directed    Drink plenty of fluids.  Prune juice may be helpful.  You may use a stool softener, such as Colace (over the counter) 100 mg twice a day.  Use MiraLax (over the counter) for constipation as needed.   Diet - low sodium heart healthy   Complete by:  As directed    Do not put a pillow under the knee. Place it under the heel.   Complete by:  As directed    Increase activity slowly as tolerated   Complete by:  As directed      Follow-up Information    Nadara Mustard, MD In 1  week.   Specialty:  Orthopedic Surgery Contact information: [address] [place] [address] [phone]        Home, [facility] Follow up.   Specialty:  Home Health Services Why:  A reopresentative from [facility] will contact you to arrange start date and time for your therapy. Contact information: [address] [place] [address] [phone]            Signed: Lazaro Arms, PA-C 09/20/2018, 1:01 PM  [facility] [phone]

## 2018-09-20 NOTE — Plan of Care (Signed)
  Problem: Education: Goal: Knowledge of General Education information will improve Description Including pain rating scale, medication(s)/side effects and non-pharmacologic comfort measures 09/20/2018 1251 by Janan Halter, RN Outcome: Adequate for Discharge 09/20/2018 1249 by Janan Halter, RN Outcome: Progressing   Problem: Health Behavior/Discharge Planning: Goal: Ability to manage health-related needs will improve 09/20/2018 1251 by Janan Halter, RN Outcome: Adequate for Discharge 09/20/2018 1249 by Janan Halter, RN Outcome: Progressing   Problem: Clinical Measurements: Goal: Ability to maintain clinical measurements within normal limits will improve 09/20/2018 1251 by Janan Halter, RN Outcome: Adequate for Discharge 09/20/2018 1249 by Janan Halter, RN Outcome: Progressing Goal: Will remain free from infection 09/20/2018 1251 by Janan Halter, RN Outcome: Adequate for Discharge 09/20/2018 1249 by Janan Halter, RN Outcome: Progressing Goal: Diagnostic test results will improve 09/20/2018 1251 by Janan Halter, RN Outcome: Adequate for Discharge 09/20/2018 1249 by Janan Halter, RN Outcome: Progressing Goal: Respiratory complications will improve 09/20/2018 1251 by Janan Halter, RN Outcome: Adequate for Discharge 09/20/2018 1249 by Janan Halter, RN Outcome: Progressing Goal: Cardiovascular complication will be avoided 09/20/2018 1251 by Janan Halter, RN Outcome: Adequate for Discharge 09/20/2018 1249 by Janan Halter, RN Outcome: Progressing   Problem: Activity: Goal: Risk for activity intolerance will decrease 09/20/2018 1251 by Janan Halter, RN Outcome: Adequate for Discharge 09/20/2018 1249 by Janan Halter, RN Outcome: Progressing   Problem: Nutrition: Goal: Adequate nutrition will be maintained 09/20/2018 1251 by Janan Halter, RN Outcome: Adequate for Discharge 09/20/2018 1249 by Janan Halter, RN Outcome: Progressing   Problem: Coping: Goal: Level of anxiety  will decrease 09/20/2018 1251 by Janan Halter, RN Outcome: Adequate for Discharge 09/20/2018 1249 by Janan Halter, RN Outcome: Progressing   Problem: Elimination: Goal: Will not experience complications related to bowel motility 09/20/2018 1251 by Janan Halter, RN Outcome: Adequate for Discharge 09/20/2018 1249 by Janan Halter, RN Outcome: Progressing Goal: Will not experience complications related to urinary retention 09/20/2018 1251 by Janan Halter, RN Outcome: Adequate for Discharge 09/20/2018 1249 by Janan Halter, RN Outcome: Progressing   Problem: Pain Managment: Goal: General experience of comfort will improve 09/20/2018 1251 by Janan Halter, RN Outcome: Adequate for Discharge 09/20/2018 1249 by Janan Halter, RN Outcome: Progressing   Problem: Safety: Goal: Ability to remain free from injury will improve 09/20/2018 1251 by Janan Halter, RN Outcome: Adequate for Discharge 09/20/2018 1249 by Janan Halter, RN Outcome: Progressing   Problem: Skin Integrity: Goal: Risk for impaired skin integrity will decrease 09/20/2018 1251 by Janan Halter, RN Outcome: Adequate for Discharge 09/20/2018 1249 by Janan Halter, RN Outcome: Progressing

## 2018-09-20 NOTE — Progress Notes (Signed)
Patient ID: Cheryl Fox, female   DOB: May 27, 1981, 38 y.o.   MRN: [id number]  Patient's anticipated LOS is less than 2 midnights, meeting these requirements: - Younger than 83 - Lives within 1 hour of care - Has a competent adult at home to recover with post-op recover - NO history of  - Chronic pain requiring opiods  - Diabetes  - Coronary Artery Disease  - Heart failure  - Heart attack  - Stroke  - DVT/VTE  - Cardiac arrhythmia  - Respiratory Failure/COPD  - Renal failure  - Anemia  - Advanced Liver disease

## 2018-09-20 NOTE — Progress Notes (Signed)
Subjective: 2 Days Post-Op Procedure(s) (LRB): RIGHT TOTAL KNEE ARTHROPLASTY (Right) Application Of Wound Vac (Right) Patient reports pain as moderate.    Objective: Vital signs in last 24 hours: Temp:  [98.3 F (36.8 C)-99.3 F (37.4 C)] 99.3 F (37.4 C) (03/13 0446) Pulse Rate:  [93-105] 93 (03/13 0446) Resp:  [16-18] 16 (03/13 0446) BP: (108-139)/(67-84) 139/84 (03/13 0446) SpO2:  [90 %-100 %] 100 % (03/13 0446)  Intake/Output from previous day: 03/12 0701 - 03/13 0700 In: 1000.5 [P.O.:720; I.V.:280.5] Out: -  Intake/Output this shift: No intake/output data recorded.  No results for input(s): HGB in the last 72 hours. No results for input(s): WBC, RBC, HCT, PLT in the last 72 hours. No results for input(s): NA, K, CL, CO2, BUN, CREATININE, GLUCOSE, CALCIUM in the last 72 hours. No results for input(s): LABPT, INR in the last 72 hours.  VAC dressing intact over right knee and functioning well. VAC canister without drainage.  Neurovascular intact distally right leg.    Assessment/Plan: 2 Days Post-Op Procedure(s) (LRB): RIGHT TOTAL KNEE ARTHROPLASTY (Right) Application Of Wound Vac (Right) Up with therapy Discharge home with home health   Anticipated LOS equal to or greater than 2 midnights due to - Age 39 and older with one or more of the following:  - Obesity  - Expected need for hospital services (PT, OT, Nursing) required for safe  discharge  - Anticipated need for postoperative skilled nursing care or inpatient rehab  - Active co-morbidities: Chronic pain requiring opiods OR   - Unanticipated findings during/Post Surgery: None  - Patient is a high risk of re-admission due to: None    Evans Levee, PA-C 09/20/2018, 8:06 AM  [facility] [phone]

## 2018-09-20 NOTE — Progress Notes (Signed)
Occupational Therapy Treatment Patient Details Name: Cheryl Fox MRN: [id number] DOB: 1981/03/30 Today's Date: 09/20/2018    History of present illness Patient is 38 y/o female s/p R TKA. PMH includes morbid obesity, hep C, HTN, and anxiety.    OT comments  Pt progressing toward OT goals. Pt educated on tub transfer and LB dressing with AE (reacher and sock). Pt limited with pain but able to perform tasks. Provided with handout and able to perform transfer with Min A and VCs for sequencing and safety. Mod A LB ADL with use of AE while seated at EOB. Min guard Toilet transfer to [facility]. Pt provided with reacher and sock aid to take home. DC recommendation remains appropriate. OT will continue to follow acutely.    Follow Up Recommendations  Follow surgeon's recommendation for DC plan and follow-up therapies    Equipment Recommendations  3 in 1 bedside commode;Tub/shower bench    Recommendations for Other Services      Precautions / Restrictions Precautions Precautions: Fall;Knee Precaution Booklet Issued: Yes (comment) Precaution Comments: reviewed knee precautions with patient Restrictions Weight Bearing Restrictions: Yes RLE Weight Bearing: Weight bearing as tolerated       Mobility Bed Mobility Overal bed mobility: Needs Assistance(Simultaneous filing. User may not have seen previous data.) Bed Mobility: Supine to Sit;Sit to Supine(Simultaneous filing. User may not have seen previous data.)     Supine to sit: Min assist(Simultaneous filing. User may not have seen previous data.) Sit to supine: Min assist   General bed mobility comments: MinA for management of R LE Transfers Overall transfer level: Needs assistance Equipment used: Rolling walker (2 wheeled) Transfers: Sit to/from Stand Sit to Stand: Min guard         General transfer comment: Min guard for safety during sit to stand, as pt tends to lean forward over RW and take extra time to stand upright. VCs for  sequencing and safety.    Balance Overall balance assessment: Needs assistance Sitting-balance support: Bilateral upper extremity supported;Feet supported Sitting balance-Leahy Scale: Fair Sitting balance - Comments: Able to sit EOB without assistance   Standing balance support: Bilateral upper extremity supported;During functional activity Standing balance-Leahy Scale: Poor Standing balance comment: heavy reliance on RW for bil UE support                           ADL either performed or assessed with clinical judgement   ADL Overall ADL's : Needs assistance/impaired             Lower Body Bathing: Moderate assistance;Sitting/lateral leans;Sit to/from stand       Lower Body Dressing: Moderate assistance;Sitting/lateral leans Lower Body Dressing Details (indicate cue type and reason): educated on AE for LB dressing. Toilet Transfer: Min guard;Ambulation;RW;BSC   Toileting- Clothing Manipulation and Hygiene: Minimal assistance;Sit to/from stand;With adaptive equipment(BSC)   Tub/ Shower Transfer: Minimal assistance;Cueing for safety;Cueing for sequencing;Ambulation;3 in 1;Rolling walker Tub/Shower Transfer Details (indicate cue type and reason): Pt educated on tub transfer with handout provided. Functional mobility during ADLs: Min guard;Rolling walker General ADL Comments: Pt demonstrated increased pain during session. RN provided meds prior to session.      Vision       Perception     Praxis      Cognition Arousal/Alertness: Awake/alert Behavior During Therapy: WFL for tasks assessed/performed Overall Cognitive Status: Within Functional Limits for tasks assessed  Exercises Total Joint Exercises Hip ABduction/ADduction: (2 AROM with max effort, increased pain)   Shoulder Instructions       General Comments      Pertinent Vitals/ Pain       Pain Assessment: 0-10 Pain Score: 8  Pain  Location: R knee Pain Descriptors / Indicators: Aching;Sore;Operative site guarding;Discomfort Pain Intervention(s): Limited activity within patient's tolerance;Monitored during session;Repositioned;Premedicated before session  Home Living                                          Prior Functioning/Environment              Frequency  Min 2X/week        Progress Toward Goals  OT Goals(current goals can now be found in the care plan section)  Progress towards OT goals: Progressing toward goals  Acute Rehab OT Goals Patient Stated Goal: "be in less pain" OT Goal Formulation: With patient Time For Goal Achievement: 10/03/18 Potential to Achieve Goals: Good ADL Goals Pt Will Perform Grooming: with supervision;with set-up;with modified independence;standing Pt Will Perform Lower Body Bathing: with min assist;with min guard assist;with caregiver independent in assisting;with adaptive equipment Pt Will Perform Lower Body Dressing: with min assist;with min guard assist;with caregiver independent in assisting;with adaptive equipment Pt Will Transfer to Toilet: with supervision;with modified independence;ambulating Pt Will Perform Toileting - Clothing Manipulation and hygiene: with min guard assist;sit to/from stand Pt Will Perform Tub/Shower Transfer: with supervision;ambulating;rolling walker;tub bench;3 in 1  Plan Discharge plan remains appropriate;Frequency remains appropriate    Co-evaluation                 AM-PAC OT "6 Clicks" Daily Activity     Outcome Measure   Help from another person eating meals?: None Help from another person taking care of personal grooming?: A Little Help from another person toileting, which includes using toliet, bedpan, or urinal?: A Little Help from another person bathing (including washing, rinsing, drying)?: A Lot Help from another person to put on and taking off regular upper body clothing?: A Little Help from  another person to put on and taking off regular lower body clothing?: A Lot 6 Click Score: 17    End of Session Equipment Utilized During Treatment: Gait belt;Rolling walker(BSC)  OT Visit Diagnosis: Other abnormalities of gait and mobility (R26.89);Pain Pain - Right/Left: Right Pain - part of body: Knee   Activity Tolerance Patient limited by pain   Patient Left in bed;with call bell/phone within reach;with nursing/sitter in room   Nurse Communication          Time: 2334-3568 OT Time Calculation (min): 44 min  Charges: OT General Charges $OT Visit: 1 Visit OT Treatments $Self Care/Home Management : 38-52 mins  Marquette Old, MSOT, OTR/L  Supplemental Rehabilitation Services  [phone]  Zigmund Daniel 09/20/2018, 11:00 AM

## 2018-09-20 NOTE — Progress Notes (Signed)
AVS d/c instructions explained. No further questions. 3 in 1 and RW present with patient. IV d/c.

## 2018-09-21 ENCOUNTER — Telehealth (INDEPENDENT_AMBULATORY_CARE_PROVIDER_SITE_OTHER): Payer: Self-pay | Admitting: Orthopaedic Surgery

## 2018-09-21 ENCOUNTER — Encounter (INDEPENDENT_AMBULATORY_CARE_PROVIDER_SITE_OTHER): Payer: Self-pay | Admitting: Orthopaedic Surgery

## 2018-09-21 NOTE — Progress Notes (Unsigned)
Patients spouse called me yesterday about patients pain meds. Has percocet 5/325 and was suppose to get percocet 10/325. I discussed with her that as per the message on the phone when she called the office our policy is not to prescribe narcotics after hrs. She stated she had to go since she was at a provider for her daughter and it sounded like patient was in the background. She said she would call me back, she had to go and hung up. Did not hear from her until today when again  Cheryl Fox called me back and said therapist had just left and therapist was  suppose to call me due to the patient being in pain post op from TKA.  Cheryl Fox said she had 13 teeth removed and that Percocet 5/325 work for her but Cheryl Fox needed stronger pain medication.  I asked about previous pain medication she been on and initially she told me she was taking Suboxone 8 mg on a pain contract when she talked to me today she states that Brend had been off Suboxone for more than a year.  I again discussed with her that office policy is not to prescribe additional narcotics after hours and that patient did have pain medication prescribed.  She then proceeded to read to me the list of medications she was discharged on.  Patient was extremely unhappy I would not prescribe Percocet 10/325.  We had a discussion about pain medication patient previously was fully been taken.  Patient then called me back few minutes later and we discussed this further.  She then texted me and this is what she text "I Cheryl Fox once again reaching out to you by text since you are unprofessional and rude you have hung up on numerous times her primary care Dr. Maretta Bees and Dr. Lajoyce Corners have been in close contact throughout this whole procedure and you can clearly see that the wrong medicine.  And here is the paperwork stating the right medicine that should have been called in.  All the things you talk to me about the Suboxone is irrelevant because this is been discussed  with the ones who are in her care plan.  You were extremely rude and I promised that I will go to the medical board to get your license taken I will do everything I can to prove to them or anyone else regarding YOU. Her are the facts so maybe you can see the truth and not your opinion."  I responded to her text with the following message "sorry as I discussed with you on the phone she has pain medication and can call Dr. Lajoyce Corners on Monday and he can correct this if the Percocet 10/325 was his plan.  Looks like medication was written by Bonita Quin, PA.  You discussed the 90 pills of Suboxone that she had and you told me that she had not been on the medication for more than 1 year.  As I discussed we do not fill narcotics as the phone message at the office stated and she has a large bottle of Percocet 5/325 you are holding.  You terminated our first call when you are with your daughter.  You can talk with [name] or Dr. Lajoyce Corners on Monday.  Elevate knee above heart will help as well as ice on and off."    [place] narcotics shows overall risk for 740.  581 narcotic 41 sedative 040 stimulants and additional 3 risk factors.  I am unable to provide additional  narcotics at this time as I discussed with patients spouse .

## 2018-09-22 NOTE — Telephone Encounter (Signed)
Call pt's spouse and discussed. Rx exceeded narcotic limit and pharmacy would not fill 10/325 per Dr. Lajoyce Corners.

## 2018-09-23 ENCOUNTER — Telehealth (INDEPENDENT_AMBULATORY_CARE_PROVIDER_SITE_OTHER): Payer: Self-pay | Admitting: Orthopedic Surgery

## 2018-09-23 ENCOUNTER — Encounter (INDEPENDENT_AMBULATORY_CARE_PROVIDER_SITE_OTHER): Payer: Self-pay | Admitting: Orthopedic Surgery

## 2018-09-23 ENCOUNTER — Other Ambulatory Visit: Payer: Self-pay

## 2018-09-23 ENCOUNTER — Ambulatory Visit (INDEPENDENT_AMBULATORY_CARE_PROVIDER_SITE_OTHER): Payer: Medicaid Other | Admitting: Orthopedic Surgery

## 2018-09-23 VITALS — Ht 66.0 in | Wt 278.9 lb

## 2018-09-23 DIAGNOSIS — M1711 Unilateral primary osteoarthritis, right knee: Secondary | ICD-10-CM

## 2018-09-23 DIAGNOSIS — Z96651 Presence of right artificial knee joint: Secondary | ICD-10-CM

## 2018-09-23 MED ORDER — OXYCODONE-ACETAMINOPHEN 10-325 MG PO TABS: 1.0000 | ORAL_TABLET | ORAL | 0 refills | Status: DC | PRN

## 2018-09-23 NOTE — Progress Notes (Signed)
Office Visit Note   Patient: Cheryl Fox           Date of Birth: [birth year]           MRN: [id number] Visit Date: 09/23/2018              Requested by: Center, [facility] [address] [place], [place] [address] PCP: Center, [facility]  Chief Complaint  Patient presents with  . Right Knee - Routine Post Op    09/18/2018 TKA wound vac removed      HPI: Patient is a 38 year old woman who presents 5 days status post total knee arthroplasty.  Patient is currently ambulating with a walker patient states the pharmacy would not feel her medicine.  She states that Tylenol makes her stomach hurts.  She is currently taking Percocet and 800 mg ibuprofen 3 times a day.  Assessment & Plan: Visit Diagnoses:  1. Primary osteoarthritis of right knee   2. Total knee replacement status, right     Plan: We will remove the wound VAC today she may shower.  A prescription was called in for Percocet 10 mg tablets.  She will continue to work on knee extension.  Harvest sutures at follow-up.  Follow-Up Instructions: Return in about 1 week (around 09/30/2018).   Ortho Exam  Patient is alert, oriented, no adenopathy, well-dressed, normal affect, normal respiratory effort. Examination the incision is well approximated there is no redness no cellulitis she lacks about 5 degrees to full extension.  Her PMP aware score is 730.  Discussed that with her current narcotic prescriptions the pharmacy would not fill the prescription that was written.  Patient states that she has not been taking her Suboxone for a week.  Imaging: No results found. No images are attached to the encounter.  Labs: Lab Results  Component Value Date   REPTSTATUS 09/27/2012 FINAL 09/25/2012   CULT  09/25/2012    Multiple bacterial morphotypes present, none predominant. Suggest appropriate recollection if clinically indicated.     Lab Results  Component Value Date   ALBUMIN 3.4 (L) 09/09/2018   ALBUMIN 3.6  09/25/2012    Body mass index is 45.01 kg/m.  Orders:  No orders of the defined types were placed in this encounter.  Meds ordered this encounter  Medications  . oxyCODONE-acetaminophen (PERCOCET) 10-325 MG tablet    Sig: Take 1 tablet by mouth every 4 (four) hours as needed for pain.    Dispense:  40 tablet    Refill:  0     Procedures: No procedures performed  Clinical Data: No additional findings.  ROS:  All other systems negative, except as noted in the HPI. Review of Systems  Objective: Vital Signs: Ht 5\' 6"  (1.676 m)   Wt 278 lb 14.1 oz (126.5 kg)   LMP 08/10/2014 (Approximate)   BMI 45.01 kg/m   Specialty Comments:  No specialty comments available.  PMFS History: Patient Active Problem List   Diagnosis Date Noted  . Total knee replacement status, right 09/18/2018  . Unilateral primary osteoarthritis, right knee 09/03/2018  . Pain in left wrist 07/16/2018  . Pain in both hands 12/20/2017  . Body mass index 40.0-44.9, adult (HCC) 12/20/2017  . Morbid obesity (HCC) 12/20/2017   Past Medical History:  Diagnosis Date  . Acid reflux   . Anxiety   . Arthritis   . Complication of anesthesia    after hysterectomy, BP was very high  . Constipation due to pain medication   .  Depression   . Hepatitis C    treated with Maverick  . Hypertension     History reviewed. No pertinent family history.  Past Surgical History:  Procedure Laterality Date  . ABDOMINAL HYSTERECTOMY    . APPLICATION OF WOUND VAC Right 09/18/2018   Procedure: Application Of Wound Vac;  Surgeon: Nadara Mustard, MD;  Location: [facility] OR;  Service: Orthopedics;  Laterality: Right;  . CHOLECYSTECTOMY    . TOTAL KNEE ARTHROPLASTY Right 09/18/2018   Procedure: RIGHT TOTAL KNEE ARTHROPLASTY;  Surgeon: Nadara Mustard, MD;  [facility];  Service: Orthopedics;  Laterality: Right;   Social History   Occupational History  . Not on file  Tobacco Use  . Smoking status: Former Smoker     Packs/day: 0.50    Types: Cigarettes    Last attempt to quit: 09/06/2018    Years since quitting: 0.0  . Smokeless tobacco: Never Used  Substance and Sexual Activity  . Alcohol use: No    Comment: occasion  . Drug use: No  . Sexual activity: Not on file

## 2018-09-23 NOTE — Telephone Encounter (Signed)
I called pharm and they advised that the pt picked up her rx for suboxone on Saturday 09/22/2018 pt had advised in the office today that she had been off of this medication for a week. I advised that per Dr. Lajoyce Corners that she MAY NOT TAKE THESE MEDICATIONS TOGETHER  Advised that she will stop breathing if she does. Pt voiced understanding. Advised that I would document that we had this conversation and that she understood what was advised. Called pharm and sw Kim advised per Dr. Lajoyce Corners to fill the percocet rx as she is post surgical and to call with any questions.

## 2018-09-23 NOTE — Telephone Encounter (Signed)
Received voicemail message from the pharmacist Zella Ball) from [facility] in [place]. The pharmacist advised patient was prescribed (Percocet) after surgery. The pharmacist also said patient said she was told to hold the Suboxone while on the percocet. The pharmacist said patient had  the Suboxone  in her possession. Patient picked the Rx up on Saturday. The percocet is on hold until the phamacy speak with the provider. The number to contact the pharmacist is [phone]

## 2018-09-24 ENCOUNTER — Telehealth (INDEPENDENT_AMBULATORY_CARE_PROVIDER_SITE_OTHER): Payer: Self-pay | Admitting: Orthopedic Surgery

## 2018-09-24 NOTE — Telephone Encounter (Signed)
Called and lm on vm to advise verbal ok for this pt to have physical therapy s/p total knee.

## 2018-09-24 NOTE — Telephone Encounter (Signed)
Received voicemail message from [place] (PT) with [facility] needing verbal order for HHPT 1 Wk 1 and twice a week for 2 Wks. The number to contact Byrd Hesselbach is [phone]

## 2018-09-30 ENCOUNTER — Encounter (INDEPENDENT_AMBULATORY_CARE_PROVIDER_SITE_OTHER): Payer: Self-pay | Admitting: Orthopedic Surgery

## 2018-09-30 ENCOUNTER — Other Ambulatory Visit: Payer: Self-pay

## 2018-09-30 ENCOUNTER — Ambulatory Visit (INDEPENDENT_AMBULATORY_CARE_PROVIDER_SITE_OTHER): Payer: Medicaid Other | Admitting: Physician Assistant

## 2018-09-30 VITALS — Ht 66.0 in | Wt 278.0 lb

## 2018-09-30 DIAGNOSIS — Z4802 Encounter for removal of sutures: Secondary | ICD-10-CM

## 2018-09-30 DIAGNOSIS — Z96651 Presence of right artificial knee joint: Secondary | ICD-10-CM

## 2018-09-30 MED ORDER — OXYCODONE-ACETAMINOPHEN 10-325 MG PO TABS: 1.0000 | ORAL_TABLET | Freq: Four times a day (QID) | ORAL | 0 refills | Status: AC | PRN

## 2018-09-30 NOTE — Progress Notes (Signed)
Office Visit Note   Patient: Cheryl Fox           Date of Birth: 1981-07-05           MRN: [id number] Visit Date: 09/30/2018              Requested by: Center, [facility] [address] [place], [place] [address] PCP: Center, [facility]  Chief Complaint  Patient presents with  . Right Knee - Routine Post Op      HPI: The patient is a 38 year old woman who is seen for postoperative follow-up following a right total knee arthroplasty on 09/18/2018.  She still having a lot of pain.  She is utilizing a walker at all times.  She is working with physical therapy at home and is making good progress.  Assessment & Plan: Visit Diagnoses:  1. Total knee replacement status, right     Plan: Staples were removed today.  Patient will continue home health physical therapy.  She did request another prescription for Percocet 10/325 mg tablets.  We discussed that she can utilize this but cannot take this with the Suboxone and was cautioned against taking these together.  She will continue physical therapy and follow-up here in 2 weeks.  Follow-Up Instructions: Return in about 2 weeks (around 10/14/2018).   Ortho Exam  Patient is alert, oriented, no adenopathy, well-dressed, normal affect, normal respiratory effort. Right knee incision is healing well and staples were removed.  There are no signs of cellulitis or infection.  Range of motion lacks about 10 degrees of full extension but she has 95 degrees of flexion.  She ambulates with a walker.  She is neurovascularly intact distally.  Imaging: No results found. No images are attached to the encounter.  Labs: Lab Results  Component Value Date   REPTSTATUS 09/27/2012 FINAL 09/25/2012   CULT  09/25/2012    Multiple bacterial morphotypes present, none predominant. Suggest appropriate recollection if clinically indicated.     Lab Results  Component Value Date   ALBUMIN 3.4 (L) 09/09/2018   ALBUMIN 3.6 09/25/2012    Body mass  index is 44.87 kg/m.  Orders:  No orders of the defined types were placed in this encounter.  No orders of the defined types were placed in this encounter.    Procedures: No procedures performed  Clinical Data: No additional findings.  ROS:  All other systems negative, except as noted in the HPI. Review of Systems  Objective: Vital Signs: Ht 5\' 6"  (1.676 m)   Wt 278 lb (126.1 kg)   LMP 08/10/2014 (Approximate)   BMI 44.87 kg/m   Specialty Comments:  No specialty comments available.  PMFS History: Patient Active Problem List   Diagnosis Date Noted  . Total knee replacement status, right 09/18/2018  . Unilateral primary osteoarthritis, right knee 09/03/2018  . Pain in left wrist 07/16/2018  . Pain in both hands 12/20/2017  . Body mass index 40.0-44.9, adult (HCC) 12/20/2017  . Morbid obesity (HCC) 12/20/2017   Past Medical History:  Diagnosis Date  . Acid reflux   . Anxiety   . Arthritis   . Complication of anesthesia    after hysterectomy, BP was very high  . Constipation due to pain medication   . Depression   . Hepatitis C    treated with Maverick  . Hypertension     History reviewed. No pertinent family history.  Past Surgical History:  Procedure Laterality Date  . ABDOMINAL HYSTERECTOMY    .  APPLICATION OF WOUND VAC Right 09/18/2018   Procedure: Application Of Wound Vac;  Surgeon: Nadara Mustard, MD;  Location: [facility] OR;  Service: Orthopedics;  Laterality: Right;  . CHOLECYSTECTOMY    . TOTAL KNEE ARTHROPLASTY Right 09/18/2018   Procedure: RIGHT TOTAL KNEE ARTHROPLASTY;  Surgeon: Nadara Mustard, MD;  Location: [facility] OR;  Service: Orthopedics;  Laterality: Right;   Social History   Occupational History  . Not on file  Tobacco Use  . Smoking status: Former Smoker    Packs/day: 0.50    Types: Cigarettes    Last attempt to quit: 09/06/2018    Years since quitting: 0.0  . Smokeless tobacco: Never Used  Substance and Sexual Activity  . Alcohol use: No     Comment: occasion  . Drug use: No  . Sexual activity: Not on file

## 2018-10-08 ENCOUNTER — Telehealth (INDEPENDENT_AMBULATORY_CARE_PROVIDER_SITE_OTHER): Payer: Self-pay

## 2018-10-08 NOTE — Telephone Encounter (Signed)
I called pt and she advised that she was seen by her PCP yesterday for a cough ( seasonal allergies). Her son was treated at [facility] 2 weeks ago for double PNA. She answered no to all other questions. I advised that her spouse and child should wait in there vehicle  during appointment.

## 2018-10-09 ENCOUNTER — Ambulatory Visit (INDEPENDENT_AMBULATORY_CARE_PROVIDER_SITE_OTHER): Payer: Medicaid Other | Admitting: Family

## 2018-10-10 ENCOUNTER — Telehealth (INDEPENDENT_AMBULATORY_CARE_PROVIDER_SITE_OTHER): Payer: Self-pay | Admitting: Radiology

## 2018-10-10 NOTE — Telephone Encounter (Signed)
Unable to get in touch with patient to ask screening questions for appointment on 4/6. I called number in chart and a young boy answered saying Cheryl Fox did not live there, no other number to call.

## 2018-10-14 ENCOUNTER — Encounter (INDEPENDENT_AMBULATORY_CARE_PROVIDER_SITE_OTHER): Payer: Self-pay | Admitting: Orthopedic Surgery

## 2018-10-14 ENCOUNTER — Other Ambulatory Visit: Payer: Self-pay

## 2018-10-14 ENCOUNTER — Ambulatory Visit (INDEPENDENT_AMBULATORY_CARE_PROVIDER_SITE_OTHER): Payer: Medicaid Other | Admitting: Orthopedic Surgery

## 2018-10-14 VITALS — Ht 66.0 in | Wt 278.0 lb

## 2018-10-14 DIAGNOSIS — Z96651 Presence of right artificial knee joint: Secondary | ICD-10-CM

## 2018-10-14 NOTE — Progress Notes (Signed)
Office Visit Note   Patient: Cheryl Fox           Date of Birth: 04-Nov-1980           MRN: [id number] Visit Date: 10/14/2018              Requested by: Center, [facility] [address] [place], [place] [address] PCP: Center, [facility]  Chief Complaint  Patient presents with  . Right Knee - Routine Post Op    09/18/2018 right knee total knee      HPI: Patient is a 38 year old woman who presents in follow-up status post right total knee arthroplasty.  She is full weightbearing with a walker she states she twisted her knee on Saturday but seems to be doing fine today.  She states that her pain is about a 4/5 today she states that her severe arthritic pain prior to surgery has resolved.  Patient states she has not been able to schedule physical therapy as an outpatient yet.  Patient states she did have pneumonia after surgery.  She completed her home health therapy last week.  Assessment & Plan: Visit Diagnoses:  1. Total knee replacement status, right     Plan: Patient will continue to look for outpatient physical therapy otherwise she will do the 10 exercises that she was provided by her home health therapist.  Discussed the importance of strengthening of the muscles and scar massage.  Follow-Up Instructions: Return in about 4 weeks (around 11/11/2018).   Ortho Exam  Patient is alert, oriented, no adenopathy, well-dressed, normal affect, normal respiratory effort. Examination patient has excellent healing of the incision shows no redness no cellulitis no keloiding.  She has full extension flexion to 110 degrees.  There is no ecchymosis no bruising.  Imaging: No results found. No images are attached to the encounter.  Labs: Lab Results  Component Value Date   REPTSTATUS 09/27/2012 FINAL 09/25/2012   CULT  09/25/2012    Multiple bacterial morphotypes present, none predominant. Suggest appropriate recollection if clinically indicated.     Lab Results   Component Value Date   ALBUMIN 3.4 (L) 09/09/2018   ALBUMIN 3.6 09/25/2012    Body mass index is 44.87 kg/m.  Orders:  No orders of the defined types were placed in this encounter.  No orders of the defined types were placed in this encounter.    Procedures: No procedures performed  Clinical Data: No additional findings.  ROS:  All other systems negative, except as noted in the HPI. Review of Systems  Objective: Vital Signs: Ht 5\' 6"  (1.676 m)   Wt 278 lb (126.1 kg)   LMP 08/10/2014 (Approximate)   BMI 44.87 kg/m   Specialty Comments:  No specialty comments available.  PMFS History: Patient Active Problem List   Diagnosis Date Noted  . Total knee replacement status, right 09/18/2018  . Unilateral primary osteoarthritis, right knee 09/03/2018  . Pain in left wrist 07/16/2018  . Pain in both hands 12/20/2017  . Body mass index 40.0-44.9, adult (HCC) 12/20/2017  . Morbid obesity (HCC) 12/20/2017   Past Medical History:  Diagnosis Date  . Acid reflux   . Anxiety   . Arthritis   . Complication of anesthesia    after hysterectomy, BP was very high  . Constipation due to pain medication   . Depression   . Hepatitis C    treated with Maverick  . Hypertension     History reviewed. No pertinent family history.  Past Surgical History:  Procedure Laterality Date  . ABDOMINAL HYSTERECTOMY    . APPLICATION OF WOUND VAC Right 09/18/2018   Procedure: Application Of Wound Vac;  Surgeon: Nadara Mustard, MD;  Location: [facility] OR;  Service: Orthopedics;  Laterality: Right;  . CHOLECYSTECTOMY    . TOTAL KNEE ARTHROPLASTY Right 09/18/2018   Procedure: RIGHT TOTAL KNEE ARTHROPLASTY;  Surgeon: Nadara Mustard, MD;  Location: [facility] OR;  Service: Orthopedics;  Laterality: Right;   Social History   Occupational History  . Not on file  Tobacco Use  . Smoking status: Former Smoker    Packs/day: 0.50    Types: Cigarettes    Last attempt to quit: 09/06/2018    Years since  quitting: 0.1  . Smokeless tobacco: Never Used  Substance and Sexual Activity  . Alcohol use: No    Comment: occasion  . Drug use: No  . Sexual activity: Not on file

## 2018-11-11 ENCOUNTER — Ambulatory Visit (INDEPENDENT_AMBULATORY_CARE_PROVIDER_SITE_OTHER): Payer: Medicaid Other | Admitting: Orthopedic Surgery

## 2018-11-11 ENCOUNTER — Ambulatory Visit: Payer: Self-pay | Admitting: Orthopedic Surgery

## 2018-11-11 ENCOUNTER — Other Ambulatory Visit: Payer: Self-pay

## 2018-12-23 ENCOUNTER — Ambulatory Visit: Payer: Medicaid Other | Admitting: Orthopedic Surgery

## 2019-01-01 ENCOUNTER — Encounter: Payer: Self-pay | Admitting: Family

## 2019-01-01 ENCOUNTER — Ambulatory Visit (INDEPENDENT_AMBULATORY_CARE_PROVIDER_SITE_OTHER): Payer: Medicaid Other

## 2019-01-01 ENCOUNTER — Other Ambulatory Visit: Payer: Self-pay

## 2019-01-01 ENCOUNTER — Ambulatory Visit (INDEPENDENT_AMBULATORY_CARE_PROVIDER_SITE_OTHER): Payer: Medicaid Other | Admitting: Orthopedic Surgery

## 2019-01-01 VITALS — Ht 66.0 in | Wt 278.0 lb

## 2019-01-01 DIAGNOSIS — Z96651 Presence of right artificial knee joint: Secondary | ICD-10-CM | POA: Diagnosis not present

## 2019-01-01 MED ORDER — OXYCODONE-ACETAMINOPHEN 5-325 MG PO TABS: 1.0000 | ORAL_TABLET | Freq: Four times a day (QID) | ORAL | 0 refills | Status: DC | PRN

## 2019-01-01 NOTE — Progress Notes (Signed)
Office Visit Note   Patient: Cheryl Fox           Date of Birth: November 05, 1980           MRN: [id number] Visit Date: 01/01/2019              Requested by: Center, [facility] [address] [place],  [place] [address] PCP: [place]  Chief Complaint  Patient presents with  . Right Knee - Follow-up, Pain      HPI: Patient is a 38 year old woman who is status post total knee arthroplasty 3 months ago.  She states she fell down some stairs about 2 weeks ago and then fell again 3 days ago.  Patient states her knee is stiff and she has difficulty going up and down stairs.  She states that she could not go to therapy due to COVID-19.  Assessment & Plan: Visit Diagnoses:  1. Total knee replacement status, right     Plan: Prescription was called in for Percocet a prescription is written for physical therapy discussed that without strengthening and therapy her knee will continue to be painful and have difficulties with activities of daily living.  Follow-Up Instructions: Return in about 4 weeks (around 01/29/2019).   Ortho Exam  Patient is alert, oriented, no adenopathy, well-dressed, normal affect, normal respiratory effort. Examination patient has full active extension collateral ligaments are stable there is no effusion no skin breakdown.  She has flexion to 90 degrees beyond this she states her knee is tight.  Imaging: Xr Knee 3 View Right  Result Date: 01/01/2019 3 view radiographs of the right total knee shows stable alignment no lucency.  Joint space is congruent the patella is midline.  No images are attached to the encounter.  Labs: Lab Results  Component Value Date   REPTSTATUS 09/27/2012 FINAL 09/25/2012   CULT  09/25/2012    Multiple bacterial morphotypes present, none predominant. Suggest appropriate recollection if clinically indicated.     Lab Results  Component Value Date   ALBUMIN 3.4 (L) 09/09/2018   ALBUMIN 3.6 09/25/2012    Body  mass index is 44.87 kg/m.  Orders:  Orders Placed This Encounter  Procedures  . XR KNEE 3 VIEW RIGHT   No orders of the defined types were placed in this encounter.    Procedures: No procedures performed  Clinical Data: No additional findings.  ROS:  All other systems negative, except as noted in the HPI. Review of Systems  Objective: Vital Signs: Ht 5\' 6"  (1.676 m)   Wt 278 lb (126.1 kg)   LMP 08/10/2014 (Approximate)   BMI 44.87 kg/m   Specialty Comments:  No specialty comments available.  PMFS History: Patient Active Problem List   Diagnosis Date Noted  . Total knee replacement status, right 09/18/2018  . Unilateral primary osteoarthritis, right knee 09/03/2018  . Pain in left wrist 07/16/2018  . Pain in both hands 12/20/2017  . Body mass index 40.0-44.9, adult ([place]) 12/20/2017  . Morbid obesity ([place]) 12/20/2017   Past Medical History:  Diagnosis Date  . Acid reflux   . Anxiety   . Arthritis   . Complication of anesthesia    after hysterectomy, BP was very high  . Constipation due to pain medication   . Depression   . Hepatitis C    treated with Maverick  . Hypertension     History reviewed. No pertinent family history.  Past Surgical History:  Procedure Laterality Date  . ABDOMINAL  HYSTERECTOMY    . APPLICATION OF WOUND VAC Right 09/18/2018   Procedure: Application Of Wound Vac;  Surgeon: Nadara Mustarduda, Daruis Swaim V, MD;  [facility];  Service: Orthopedics;  Laterality: Right;  . CHOLECYSTECTOMY    . TOTAL KNEE ARTHROPLASTY Right 09/18/2018   Procedure: RIGHT TOTAL KNEE ARTHROPLASTY;  Surgeon: Nadara Mustarduda, Flannery Cavallero V, MD;  [facility];  Service: Orthopedics;  Laterality: Right;   Social History   Occupational History  . Not on file  Tobacco Use  . Smoking status: Former Smoker    Packs/day: 0.50    Types: Cigarettes    Quit date: 09/06/2018    Years since quitting: 0.3  . Smokeless tobacco: Never Used  Substance and Sexual Activity  . Alcohol use: No     Comment: occasion  . Drug use: No  . Sexual activity: Not on file

## 2019-01-01 NOTE — Addendum Note (Signed)
Addended by: Lorayne Bender on: 01/01/2019 03:43 PM   Modules accepted: Orders

## 2019-01-02 ENCOUNTER — Ambulatory Visit: Payer: Medicaid Other

## 2019-01-06 ENCOUNTER — Ambulatory Visit: Payer: Medicaid Other | Attending: Orthopedic Surgery | Admitting: Physical Therapy

## 2019-09-12 ENCOUNTER — Ambulatory Visit: Payer: Medicaid Other | Admitting: Neurology

## 2022-01-19 ENCOUNTER — Emergency Department (HOSPITAL_COMMUNITY)
Admission: EM | Admit: 2022-01-19 | Discharge: 2022-01-19 | Disposition: A | Discharge status: Home / Self Care | Payer: Self-pay | Attending: Emergency Medicine | Admitting: Emergency Medicine

## 2022-01-19 ENCOUNTER — Emergency Department (HOSPITAL_COMMUNITY): Payer: Self-pay

## 2022-01-19 ENCOUNTER — Encounter (HOSPITAL_COMMUNITY): Payer: Self-pay | Admitting: Emergency Medicine

## 2022-01-19 ENCOUNTER — Other Ambulatory Visit: Payer: Self-pay

## 2022-01-19 DIAGNOSIS — Z7982 Long term (current) use of aspirin: Secondary | ICD-10-CM | POA: Insufficient documentation

## 2022-01-19 DIAGNOSIS — Z79899 Other long term (current) drug therapy: Secondary | ICD-10-CM | POA: Insufficient documentation

## 2022-01-19 DIAGNOSIS — J690 Pneumonitis due to inhalation of food and vomit: Secondary | ICD-10-CM | POA: Insufficient documentation

## 2022-01-19 DIAGNOSIS — Z20822 Contact with and (suspected) exposure to covid-19: Secondary | ICD-10-CM | POA: Insufficient documentation

## 2022-01-19 DIAGNOSIS — T402X1A Poisoning by other opioids, accidental (unintentional), initial encounter: Secondary | ICD-10-CM | POA: Insufficient documentation

## 2022-01-19 DIAGNOSIS — T40601A Poisoning by unspecified narcotics, accidental (unintentional), initial encounter: Secondary | ICD-10-CM

## 2022-01-19 LAB — CBC WITH DIFFERENTIAL/PLATELET
Abs Immature Granulocytes: 0.03 10*3/uL (ref 0.00–0.07)
Basophils Absolute: 0 10*3/uL (ref 0.0–0.1)
Basophils Relative: 0 %
Eosinophils Absolute: 0.1 10*3/uL (ref 0.0–0.5)
Eosinophils Relative: 1 %
HCT: 42.2 % (ref 36.0–46.0)
Hemoglobin: 13.4 g/dL (ref 12.0–15.0)
Immature Granulocytes: 0 %
Lymphocytes Relative: 18 %
Lymphs Abs: 1.9 10*3/uL (ref 0.7–4.0)
MCH: 25 pg — ABNORMAL LOW (ref 26.0–34.0)
MCHC: 31.8 g/dL (ref 30.0–36.0)
MCV: 78.6 fL — ABNORMAL LOW (ref 80.0–100.0)
Monocytes Absolute: 0.5 10*3/uL (ref 0.1–1.0)
Monocytes Relative: 5 %
Neutro Abs: 8.3 10*3/uL — ABNORMAL HIGH (ref 1.7–7.7)
Neutrophils Relative %: 76 %
Platelets: 272 10*3/uL (ref 150–400)
RBC: 5.37 MIL/uL — ABNORMAL HIGH (ref 3.87–5.11)
RDW: 15.3 % (ref 11.5–15.5)
WBC: 10.8 10*3/uL — ABNORMAL HIGH (ref 4.0–10.5)
nRBC: 0 % (ref 0.0–0.2)

## 2022-01-19 LAB — BLOOD GAS, VENOUS
Acid-Base Excess: 2 mmol/L (ref 0.0–2.0)
Bicarbonate: 26.5 mmol/L (ref 20.0–28.0)
Drawn by: 7049
FIO2: 21 %
O2 Saturation: 72 %
Patient temperature: 38.5
pCO2, Ven: 43 mmHg — ABNORMAL LOW (ref 44–60)
pH, Ven: 7.41 (ref 7.25–7.43)
pO2, Ven: 44 mmHg (ref 32–45)

## 2022-01-19 LAB — URINALYSIS, ROUTINE W REFLEX MICROSCOPIC
Bilirubin Urine: NEGATIVE
Glucose, UA: NEGATIVE mg/dL
Ketones, ur: NEGATIVE mg/dL
Leukocytes,Ua: NEGATIVE
Nitrite: POSITIVE — AB
Protein, ur: NEGATIVE mg/dL
Specific Gravity, Urine: 1.014 (ref 1.005–1.030)
pH: 5 (ref 5.0–8.0)

## 2022-01-19 LAB — COMPREHENSIVE METABOLIC PANEL
ALT: 13 U/L (ref 0–44)
AST: 16 U/L (ref 15–41)
Albumin: 4.1 g/dL (ref 3.5–5.0)
Alkaline Phosphatase: 63 U/L (ref 38–126)
Anion gap: 8 (ref 5–15)
BUN: 10 mg/dL (ref 6–20)
CO2: 25 mmol/L (ref 22–32)
Calcium: 9.4 mg/dL (ref 8.9–10.3)
Chloride: 106 mmol/L (ref 98–111)
Creatinine, Ser: 0.85 mg/dL (ref 0.44–1.00)
GFR, Estimated: >60 mL/min (ref >60)
Glucose, Bld: 109 mg/dL — ABNORMAL HIGH (ref 70–99)
Potassium: 3.2 mmol/L — ABNORMAL LOW (ref 3.5–5.1)
Sodium: 139 mmol/L (ref 135–145)
Total Bilirubin: 0.6 mg/dL (ref 0.3–1.2)
Total Protein: 7.5 g/dL (ref 6.5–8.1)

## 2022-01-19 LAB — RAPID URINE DRUG SCREEN, HOSP PERFORMED
Amphetamines: NOT DETECTED
Barbiturates: NOT DETECTED
Benzodiazepines: NOT DETECTED
Cocaine: POSITIVE — AB
Opiates: NOT DETECTED
Tetrahydrocannabinol: NOT DETECTED

## 2022-01-19 LAB — SARS CORONAVIRUS 2 BY RT PCR: SARS Coronavirus 2 by RT PCR: NEGATIVE

## 2022-01-19 LAB — PROTIME-INR
INR: 1 (ref 0.8–1.2)
Prothrombin Time: 13.4 seconds (ref 11.4–15.2)

## 2022-01-19 LAB — LACTIC ACID, PLASMA: Lactic Acid, Venous: 1.4 mmol/L (ref 0.5–1.9)

## 2022-01-19 LAB — APTT: aPTT: 28 seconds (ref 24–36)

## 2022-01-19 MED ORDER — ACETAMINOPHEN 650 MG RE SUPP: 650.0000 mg | Freq: Once | RECTAL | Status: DC

## 2022-01-19 MED ORDER — CLINDAMYCIN HCL 150 MG PO CAPS
300.0000 mg | ORAL_CAPSULE | Freq: Once | ORAL | Status: AC
  Administered 2022-01-19: 300 mg via ORAL
  Filled 2022-01-19: qty 2

## 2022-01-19 MED ORDER — LACTATED RINGERS IV SOLN
INTRAVENOUS | Status: DC
Start: 2022-01-19 — End: 2022-01-19

## 2022-01-19 MED ORDER — OXYCODONE-ACETAMINOPHEN 5-325 MG PO TABS
1.0000 | ORAL_TABLET | Freq: Once | ORAL | Status: AC
  Administered 2022-01-19: 1 via ORAL
  Filled 2022-01-19: qty 1

## 2022-01-19 MED ORDER — POTASSIUM CHLORIDE CRYS ER 20 MEQ PO TBCR
20.0000 meq | EXTENDED_RELEASE_TABLET | Freq: Once | ORAL | Status: AC
  Administered 2022-01-19: 20 meq via ORAL
  Filled 2022-01-19: qty 1

## 2022-01-19 MED ORDER — ACETAMINOPHEN 325 MG PO TABS
650.0000 mg | ORAL_TABLET | Freq: Once | ORAL | Status: AC
  Administered 2022-01-19: 650 mg via ORAL
  Filled 2022-01-19: qty 2

## 2022-01-19 MED ORDER — CLINDAMYCIN HCL 150 MG PO CAPS: 150.0000 mg | ORAL_CAPSULE | Freq: Four times a day (QID) | ORAL | 0 refills | Status: DC

## 2022-01-19 MED ORDER — LORAZEPAM 2 MG/ML IJ SOLN
1.0000 mg | Freq: Once | INTRAMUSCULAR | Status: AC
  Administered 2022-01-19: 1 mg via INTRAVENOUS
  Filled 2022-01-19: qty 1

## 2022-01-19 NOTE — ED Triage Notes (Signed)
Per [facility] pt presents with overdose, was seen by [facility] EMS given Narcan refused transport, SO called [facility] for continued drowsiness, Narcan again, per pt her SO Narcan for drugs she took over 20 hours ago.

## 2022-01-19 NOTE — ED Provider Notes (Signed)
[facility] EMERGENCY DEPARTMENT Provider Note   CSN: [id number] Arrival date & time: 01/19/22  9485     History  Chief Complaint  Patient presents with   Drug Overdose    Cheryl Fox is a 41 y.o. female.  Level 5 caveat for altered mental status.  Patient brought in by EMS from home for altered mental status.  Reportedly EMS was called out earlier for possible overdose somnolence and patient given Narcan.  Refused transport.  Wife gave another dose of Narcan and ultimately called EMS for continued somnolence.  Narcan given again.  Patient arrives minimally responsive covered in stool.  Patient unable to give any history.  The history is provided by the patient and the EMS personnel.  Altered Mental Status Presenting symptoms: partial responsiveness   Most recent episode:  Today Progression:  Unchanged Chronicity:  New      Home Medications Prior to Admission medications   Medication Sig Start Date End Date Taking? Authorizing Provider  aspirin EC 325 MG EC tablet Take 1 tablet (325 mg total) by mouth daily with breakfast. 09/20/18   Rayburn, Fanny Bien, PA-C  diphenhydrAMINE (BENADRYL) 12.5 MG/5ML elixir Take 5-10 mLs (12.5-25 mg total) by mouth every 4 (four) hours as needed for itching. 09/20/18   Rayburn, Fanny Bien, PA-C  docusate sodium (COLACE) 100 MG capsule Take 1 capsule (100 mg total) by mouth 2 (two) times daily. 09/20/18   Rayburn, Fanny Bien, PA-C  gabapentin (NEURONTIN) 800 MG tablet Take 800 mg by mouth 2 (two) times daily. 08/22/18   [provider]  lamoTRIgine (LAMICTAL) 25 MG tablet Take 25 mg by mouth daily.    [provider]  LINZESS 145 MCG CAPS capsule Take 145 mcg by mouth daily as needed for constipation. 08/23/18   [provider]  lurasidone (LATUDA) 40 MG TABS tablet Take 40 mg by mouth daily with breakfast.    [provider]  meloxicam (MOBIC) 15 MG tablet Take 15 mg by mouth daily. 06/28/18    [provider]  methocarbamol (ROBAXIN) 500 MG tablet Take 1 tablet (500 mg total) by mouth every 6 (six) hours as needed for muscle spasms. 09/20/18   Rayburn, Fanny Bien, PA-C  omeprazole (PRILOSEC) 40 MG capsule Take 40 mg by mouth daily.     [provider]  ondansetron (ZOFRAN) 4 MG tablet Take 4 mg by mouth every 12 (twelve) hours as needed for nausea/vomiting. 07/29/18   [provider]  oxyCODONE-acetaminophen (PERCOCET/ROXICET) 5-325 MG tablet Take 1 tablet by mouth every 6 (six) hours as needed for severe pain. 01/01/19   Nadara Mustard, MD  polyethylene glycol [facility] / Ethelene Hal) packet Take 17 g by mouth daily as needed for mild constipation. 09/20/18   Rayburn, Fanny Bien, PA-C  zolpidem (AMBIEN) 10 MG tablet Take 10 mg by mouth at bedtime as needed for sleep. 08/25/18   [provider]      Allergies    Celecoxib, Naproxen, Penicillins, Prednisone, Tramadol, Hydrocodone-ibuprofen, and Motrin [ibuprofen]    Review of Systems   Review of Systems  Unable to perform ROS: Mental status change    Physical Exam Updated Vital Signs BP (!) 194/146 (BP Location: Left Arm)   Pulse (!) 46   Temp (!) 101.3 F (38.5 C) (Rectal)   Resp 20   Ht 5\' 6"  (1.676 m)   Wt 126.1 kg   LMP 08/10/2014 (Approximate)   SpO2 100%   BMI 44.87 kg/m  Physical Exam Vitals  and nursing note reviewed.  Constitutional:      General: She is not in acute distress.    Appearance: Normal appearance. She is well-developed.  HENT:     Head: Normocephalic and atraumatic.  Eyes:     Conjunctiva/sclera: Conjunctivae normal.     Pupils: Pupils are equal, round, and reactive to light.     Comments: Pupils are mid and reactive  Cardiovascular:     Rate and Rhythm: Regular rhythm. Bradycardia present.     Heart sounds: No murmur heard. Pulmonary:     Effort: Pulmonary effort is normal. No respiratory distress.     Breath sounds: Normal breath sounds.  Abdominal:      Palpations: Abdomen is soft.     Tenderness: There is no abdominal tenderness. There is no guarding or rebound.  Musculoskeletal:        General: Normal range of motion.     Cervical back: Neck supple.     Right lower leg: No edema.     Left lower leg: No edema.  Skin:    General: Skin is warm and dry.     Capillary Refill: Capillary refill takes less than 2 seconds.  Neurological:     GCS: GCS eye subscore is 1. GCS verbal subscore is 4. GCS motor subscore is 5.     Comments: Patient is somnolent arousable to voice and makes some attempt to speak.  She is moving all her extremities nonfocally.     ED Results / Procedures / Treatments   Labs (all labs ordered are listed, but only abnormal results are displayed) Labs Reviewed  COMPREHENSIVE METABOLIC PANEL - Abnormal; Notable for the following components:      Result Value   Potassium 3.2 (*)    Glucose, Bld 109 (*)    All other components within normal limits  CBC WITH DIFFERENTIAL/PLATELET - Abnormal; Notable for the following components:   WBC 10.8 (*)    RBC 5.37 (*)    MCV 78.6 (*)    MCH 25.0 (*)    Neutro Abs 8.3 (*)    All other components within normal limits  URINALYSIS, ROUTINE W REFLEX MICROSCOPIC - Abnormal; Notable for the following components:   APPearance CLOUDY (*)    Hgb urine dipstick SMALL (*)    Nitrite POSITIVE (*)    Bacteria, UA MANY (*)    All other components within normal limits  BLOOD GAS, VENOUS - Abnormal; Notable for the following components:   pCO2, Ven 43 (*)    All other components within normal limits  CULTURE, BLOOD (ROUTINE X 2)  CULTURE, BLOOD (ROUTINE X 2)  SARS CORONAVIRUS 2 BY RT PCR  URINE CULTURE  GASTROINTESTINAL PANEL BY PCR, STOOL (REPLACES STOOL CULTURE)  LACTIC ACID, PLASMA  PROTIME-INR  APTT  LACTIC ACID, PLASMA  RAPID URINE DRUG SCREEN, HOSP PERFORMED    EKG None  Radiology DG Chest Port 1 View  Result Date: 01/19/2022 CLINICAL DATA:  Overdose EXAM:  PORTABLE CHEST 1 VIEW COMPARISON:  02/19/2017 FINDINGS: The heart size and mediastinal contours are within normal limits. Low lung volumes with increased interstitial markings in the bibasilar regions. No pleural effusion or pneumothorax. The visualized skeletal structures are unremarkable. IMPRESSION: Low lung volumes with increased interstitial markings in the bibasilar regions, which may reflect atelectasis versus aspiration or developing infiltrate. Electronically Signed   By: Duanne Guess D.O.   On: 01/19/2022 08:24    Procedures Procedures    Medications Ordered in ED Medications  lactated ringers infusion (0 mLs Intravenous Stopped 01/19/22 1125)  acetaminophen (TYLENOL) tablet 650 mg (650 mg Oral Given 01/19/22 0907)  potassium chloride SA (KLOR-CON M) CR tablet 20 mEq (20 mEq Oral Given 01/19/22 0943)  LORazepam (ATIVAN) injection 1 mg (1 mg Intravenous Given 01/19/22 0943)  oxyCODONE-acetaminophen (PERCOCET/ROXICET) 5-325 MG per tablet 1 tablet (1 tablet Oral Given 01/19/22 1610)  clindamycin (CLEOCIN) capsule 300 mg (300 mg Oral Given 01/19/22 1615)    ED Course/ Medical Decision Making/ A&P Clinical Course as of 01/19/22 1838  Thu Jan 19, 2022  0847 Chest x-ray with atelectasis versus infiltrate right base.  Awaiting radiology reading. [MB]  [phone] Patient's wife is here now patient more alert and is speaking.  Sounds like she smoked drugs around 130 yesterday afternoon.  Denies attempted self-harm.  She said she was using it to get high.  Wife called EMS and received Narcan.  Patient refused transport.  Wife said she was sleeping all night long and she was worried about her at 630 this morning so gave her Narcan.  Called EMS.  Patient is now more belligerent. [MB]  1352 Was able to ambulate in the department with no difficulties.  Unfortunately she is falling back asleep again. [MB]  1558 Patient now awake and complaining of pain all over.  She is allergic to all NSAIDs.  We will give  her a dose of oxycodone here. [MB]    Clinical Course User Index [MB] Terrilee Files, MD                           Medical Decision Making Amount and/or Complexity of Data Reviewed Labs: ordered. Radiology: ordered. ECG/medicine tests: ordered.  Risk OTC drugs. Prescription drug management.  This patient complains of possible overdose altered mental status unresponsiveness; this involves an extensive number of treatment Options and is a complaint that carries with it a high risk of complications and morbidity. The differential includes overdose, toxicity, aspiration, pneumonia, hypoglycemia, stroke, bleed  I ordered, reviewed and interpreted labs, which included CBC with mildly elevated white count stable hemoglobin, chemistries fairly normal although low potassium, urinalysis equivocal for infection nitrite positive but squamous cells question contamination, VBG without significant acidosis, blood culture sent, urine tox ordered, COVID-negative I ordered medication IV fluids IV sedation, oral Tylenol and antibiotics and reviewed PMP when indicated. I ordered imaging studies which included chest x-ray and I independently    visualized and interpreted imaging which showed bibasilar atelectasis versus infiltrates Additional history obtained from EMS and patient's significant other Previous records obtained and reviewed in epic, no recent admissions  Cardiac monitoring reviewed, sinus bradycardia Social determinants considered, patient with ongoing stressors Critical Interventions: None  After the interventions stated above, I reevaluated the patient and found patient's mental status to be improving.  She has been remained awake and alert for period of time now. Admission and further testing considered, no indications for admission or further testing at this time.  Oxygenation remained stable.  Will cover with antibiotics for possible aspiration.  Return instructions discussed.   Substance abuse resources given          Final Clinical Impression(s) / ED Diagnoses Final diagnoses:  Opiate overdose, accidental or unintentional, initial encounter (HCC)  Aspiration pneumonia of right lung, unspecified aspiration pneumonia type, unspecified part of lung (HCC)    Rx / DC Orders ED Discharge Orders          Ordered  clindamycin (CLEOCIN) 150 MG capsule  Every 6 hours        01/19/22 1443              Terrilee Files, MD 01/19/22 1842

## 2022-01-19 NOTE — Discharge Instructions (Addendum)
You were seen in the emergency department for being less responsive after opiate use.  Your chest x-ray showed possible pneumonia.  We are putting you on an antibiotic.  You are also given a resource guide for possible substance abuse outpatient follow-up.  Please follow-up with your regular doctor.  Return to the emergency department if any worsening or concerning symptoms.

## 2022-01-20 LAB — BLOOD CULTURE ID PANEL (REFLEXED) - BCID2

## 2022-01-20 NOTE — ED Provider Notes (Signed)
Was made aware of positive blood culture.  1 out of 4 bottles with staph species.  Talked with Dr. Thedore Mins with ID.  Overall they suspect that this is a contaminant and coag negative staph.  Nothing to do at this time.   Virgina Norfolk, DO 01/20/22 1836

## 2022-01-21 ENCOUNTER — Telehealth (HOSPITAL_BASED_OUTPATIENT_CLINIC_OR_DEPARTMENT_OTHER): Payer: Self-pay | Admitting: Emergency Medicine

## 2022-01-21 LAB — URINE CULTURE: Culture: >=100000 — AB

## 2022-01-21 NOTE — ED Provider Notes (Signed)
Blood culture positive for staph species.  No acute change in treatment plan.   Terrilee Files, MD 01/21/22 [phone]

## 2022-01-22 ENCOUNTER — Telehealth: Payer: Self-pay

## 2022-01-22 LAB — CULTURE, BLOOD (ROUTINE X 2): Special Requests: ADEQUATE

## 2022-01-22 NOTE — Telephone Encounter (Signed)
Post ED Visit - Positive Culture Follow-up: Unsuccessful Patient Follow-up  Culture assessed and recommendations reviewed by:  [x]  , Pharm.D. []  Delmar Landau, Pharm.D., BCPS AQ-ID []  , Pharm.D., BCPS []  Celedonio Miyamoto, Pharm.D., BCPS []  [place], Garvin Fila.D., BCPS, AAHIVP []  , Pharm.D., BCPS, AAHIVP []  Georgina Pillion, PharmD []  , PharmD, BCPS  Positive urine culture  []  Patient discharged without antimicrobial prescription and treatment is now indicated [x]  Organism is resistant to prescribed ED discharge antimicrobial []  Patient with positive blood cultures  Plan: call pt check for UTI symptoms is having symptoms call in Macrobid 100 mg po BID x 5 days per ED provider [place], DO Also check for s/s of batermia/sepsis if present return to the ED  Unable to contact patient after 3 attempts, letter will be sent to address on file  [address] 01/22/2022, 5:36 PM

## 2022-01-22 NOTE — Progress Notes (Signed)
ED Antimicrobial Stewardship Positive Culture Follow Up   Cheryl Fox is an 41 y.o. female who presented to [facility] on 01/19/2022 with a chief complaint of  Chief Complaint  Patient presents with   Drug Overdose    Recent Results (from the past 720 hour(s))  Blood Culture (routine x 2)     Status: Abnormal   Collection Time: 01/19/22  7:55 AM   Specimen: BLOOD  Result Value Ref Range Status   Specimen Description   Final    BLOOD RIGHT ANTECUBITAL Performed at [facility], [address] [place], [place] [address]    Special Requests   Final    BOTTLES DRAWN AEROBIC AND ANAEROBIC Blood Culture adequate volume Performed at [facility], [address], [place], [place] [address]    Culture  Setup Time   Final    GRAM POSITIVE COCCI AEROBIC BOTTLE ONLY Gram Stain Report Called to,Read Back By and Verified With: SIMMS @ 0950 ON [id number] BY HENDERSON L CRITICAL RESULT CALLED TO, READ BACK BY AND VERIFIED WITH: RN Phillis Haggis [phone] @1411  FH    Culture (A)  Final    STAPHYLOCOCCUS HOMINIS THE SIGNIFICANCE OF ISOLATING THIS ORGANISM FROM A SINGLE SET OF BLOOD CULTURES WHEN MULTIPLE SETS ARE DRAWN IS UNCERTAIN. PLEASE NOTIFY THE MICROBIOLOGY DEPARTMENT WITHIN ONE WEEK IF SPECIATION AND SENSITIVITIES ARE REQUIRED. Performed at [facility], [address], [place], [place]    Report Status 01/22/2022 FINAL  Final  Urine Culture     Status: Abnormal   Collection Time: 01/19/22  7:55 AM   Specimen: Urine, Catheterized  Result Value Ref Range Status   Specimen Description   Final    URINE, CATHETERIZED Performed at [facility], [address], [place], [place]    Special Requests   Final    NONE Performed at [facility], [address], [place], [place]    Culture >=100,000 COLONIES/mL ESCHERICHIA COLI (A)  Final   Report Status 01/21/2022 FINAL  Final   Organism ID, Bacteria ESCHERICHIA COLI (A)  Final      Susceptibility   Escherichia coli -  MIC*    AMPICILLIN <=2 SENSITIVE Sensitive     CEFAZOLIN <=4 SENSITIVE Sensitive     CEFEPIME <=0.12 SENSITIVE Sensitive     CEFTRIAXONE <=0.25 SENSITIVE Sensitive     CIPROFLOXACIN <=0.25 SENSITIVE Sensitive     GENTAMICIN <=1 SENSITIVE Sensitive     IMIPENEM <=0.25 SENSITIVE Sensitive     NITROFURANTOIN 32 SENSITIVE Sensitive     TRIMETH/SULFA <=20 SENSITIVE Sensitive     AMPICILLIN/SULBACTAM <=2 SENSITIVE Sensitive     PIP/TAZO <=4 SENSITIVE Sensitive     * >=100,000 COLONIES/mL ESCHERICHIA COLI  Blood Culture ID Panel (Reflexed)     Status: Abnormal   Collection Time: 01/19/22  7:55 AM  Result Value Ref Range Status   Enterococcus faecalis NOT DETECTED NOT DETECTED Final   Enterococcus Faecium NOT DETECTED NOT DETECTED Final   Listeria monocytogenes NOT DETECTED NOT DETECTED Final   Staphylococcus species DETECTED (A) NOT DETECTED Final    Comment: CRITICAL RESULT CALLED TO, READ BACK BY AND VERIFIED WITH: RN 01/21/22 [phone] @1411  FH    Staphylococcus aureus (BCID) NOT DETECTED NOT DETECTED Final   Staphylococcus epidermidis NOT DETECTED NOT DETECTED Final   Staphylococcus lugdunensis NOT DETECTED NOT DETECTED Final   Streptococcus species NOT DETECTED NOT DETECTED Final   Streptococcus agalactiae NOT DETECTED NOT DETECTED Final   Streptococcus  pneumoniae NOT DETECTED NOT DETECTED Final   Streptococcus pyogenes NOT DETECTED NOT DETECTED Final   A.calcoaceticus-baumannii NOT DETECTED NOT DETECTED Final   Bacteroides fragilis NOT DETECTED NOT DETECTED Final   Enterobacterales NOT DETECTED NOT DETECTED Final   Enterobacter cloacae complex NOT DETECTED NOT DETECTED Final   Escherichia coli NOT DETECTED NOT DETECTED Final   Klebsiella aerogenes NOT DETECTED NOT DETECTED Final   Klebsiella oxytoca NOT DETECTED NOT DETECTED Final   Klebsiella pneumoniae NOT DETECTED NOT DETECTED Final   Proteus species NOT DETECTED NOT DETECTED Final   Salmonella species NOT DETECTED NOT DETECTED  Final   Serratia marcescens NOT DETECTED NOT DETECTED Final   Haemophilus influenzae NOT DETECTED NOT DETECTED Final   Neisseria meningitidis NOT DETECTED NOT DETECTED Final   Pseudomonas aeruginosa NOT DETECTED NOT DETECTED Final   Stenotrophomonas maltophilia NOT DETECTED NOT DETECTED Final   Candida albicans NOT DETECTED NOT DETECTED Final   Candida auris NOT DETECTED NOT DETECTED Final   Candida glabrata NOT DETECTED NOT DETECTED Final   Candida krusei NOT DETECTED NOT DETECTED Final   Candida parapsilosis NOT DETECTED NOT DETECTED Final   Candida tropicalis NOT DETECTED NOT DETECTED Final   Cryptococcus neoformans/gattii NOT DETECTED NOT DETECTED Final    Comment: Performed at [facility], [address] [place], [place] [address]  SARS Coronavirus 2 by RT PCR (hospital order, performed in [facility] hospital lab) *cepheid single result test* Anterior Nasal Swab     Status: None   Collection Time: 01/19/22  7:59 AM   Specimen: Anterior Nasal Swab  Result Value Ref Range Status   SARS Coronavirus 2 by RT PCR NEGATIVE NEGATIVE Final    Comment: (NOTE) SARS-CoV-2 target nucleic acids are NOT DETECTED.  The SARS-CoV-2 RNA is generally detectable in upper and lower respiratory specimens during the acute phase of infection. The lowest concentration of SARS-CoV-2 viral copies this assay can detect is 250 copies / mL. A negative result does not preclude SARS-CoV-2 infection and should not be used as the sole basis for treatment or other patient management decisions.  A negative result may occur with improper specimen collection / handling, submission of specimen other than nasopharyngeal swab, presence of viral mutation(s) within the areas targeted by this assay, and inadequate number of viral copies (<250 copies / mL). A negative result must be combined with clinical observations, patient history, and epidemiological information.  Fact Sheet for Patients:    RoadLapTop.co.za  Fact Sheet for Healthcare Providers: http://kim-miller.com/  This test is not yet approved or  cleared by the [place] FDA and has been authorized for detection and/or diagnosis of SARS-CoV-2 by FDA under an Emergency Use Authorization (EUA).  This EUA will remain in effect (meaning this test can be used) for the duration of the COVID-19 declaration under Section 564(b)(1) of the Act, 21 U.S.C. section 360bbb-3(b)(1), unless the authorization is terminated or revoked sooner.  Performed at [facility], [address] [place], [place] [address]   Blood Culture (routine x 2)     Status: None (Preliminary result)   Collection Time: 01/19/22  9:24 AM   Specimen: BLOOD  Result Value Ref Range Status   Specimen Description BLOOD BLOOD RIGHT WRIST  Final   Special Requests   Final    Blood Culture adequate volume BOTTLES DRAWN AEROBIC AND ANAEROBIC   Culture   Final    NO GROWTH 3 DAYS Performed at [facility], [address], [place], [place] [address]  Report Status PENDING  Incomplete    [x]  Treated with clindamycin for aspiration pna in the setting of drug overdose and penicillin allergy. UA nitrite pos, no leuks. No UTI symptoms noted but patient altered. Recommend calling and assessing for symptoms. If symptomatic, call in below prescription.  Additionally, patient noted to have 1/4 positive blood cultures. This was discussed with ID per EDP and felt to be contaminant but would assess for concerns for sepsis/bacteremia while on phone. If present needs to return to ED for evaluation.  CONTINUE CLINDAMYCIN for aspiration pna coverage.  New antibiotic prescription (If symptomatic for UTI): Macrobid 100mg  BID x 5 days (Qty 10; Refills 0)  ED Provider: , PharmD, BCPS 01/22/2022 11:49 AM ED Clinical Pharmacist -  [phone]

## 2022-01-23 ENCOUNTER — Telehealth: Payer: Self-pay

## 2022-01-23 NOTE — Telephone Encounter (Signed)
Post ED Visit - Positive Culture Follow-up: Unsuccessful Patient Follow-up  Culture assessed and recommendations reviewed by:  []  , Pharm.D. []  Enzo Bi, Pharm.D., BCPS AQ-ID []  , Pharm.D., BCPS []  Celedonio Miyamoto, Pharm.D., BCPS []  [place], Garvin Fila.D., BCPS, AAHIVP []  , Pharm.D., BCPS, AAHIVP []  Georgina Pillion, PharmD []  , PharmD, BCPS  Positive blood culture  Plan call pt for symptoms check if symptoms have improved continue clindamycin to complete course. If pt has s/s of bacteremia please return to the ED for evaluation. Per ED provider [place], [place]  If pt has s/s of UTI call in Macrobid 100 mg po BID x 5 days per ED provider [address], DO as previously noted from positive urine culture.   []  Patient discharged without antimicrobial prescription and treatment is now indicated [x]  Organism is resistant to prescribed ED discharge antimicrobial []  Patient with positive blood cultures   Unable to contact patient after multiple attempts, letter will be sent to address on file  01/23/2022, 1:16 PM

## 2022-01-23 NOTE — Progress Notes (Addendum)
ED Antimicrobial Stewardship Positive Culture Follow Up   Cheryl Fox is an 41 y.o. female who presented to [facility] on 01/19/2022 with a chief complaint of  Chief Complaint  Patient presents with   Drug Overdose    Recent Results (from the past 720 hour(s))  Blood Culture (routine x 2)     Status: Abnormal   Collection Time: 01/19/22  7:55 AM   Specimen: BLOOD  Result Value Ref Range Status   Specimen Description   Final    BLOOD RIGHT ANTECUBITAL Performed at [facility], [address] [place], [place] [address]    Special Requests   Final    BOTTLES DRAWN AEROBIC AND ANAEROBIC Blood Culture adequate volume Performed at [facility], [address], [place], [place] [address]    Culture  Setup Time   Final    GRAM POSITIVE COCCI AEROBIC BOTTLE ONLY Gram Stain Report Called to,Read Back By and Verified With: SIMMS @ 0950 ON [id number] BY HENDERSON L CRITICAL RESULT CALLED TO, READ BACK BY AND VERIFIED WITH: RN Phillis Haggis [phone] @1411  FH    Culture (A)  Final    STAPHYLOCOCCUS HOMINIS THE SIGNIFICANCE OF ISOLATING THIS ORGANISM FROM A SINGLE SET OF BLOOD CULTURES WHEN MULTIPLE SETS ARE DRAWN IS UNCERTAIN. PLEASE NOTIFY THE MICROBIOLOGY DEPARTMENT WITHIN ONE WEEK IF SPECIATION AND SENSITIVITIES ARE REQUIRED. Performed at [facility], [address], [place], [place]    Report Status 01/22/2022 FINAL  Final  Urine Culture     Status: Abnormal   Collection Time: 01/19/22  7:55 AM   Specimen: Urine, Catheterized  Result Value Ref Range Status   Specimen Description   Final    URINE, CATHETERIZED Performed at [facility], [address], [place], [place]    Special Requests   Final    NONE Performed at [facility], [address], [place], [place]    Culture >=100,000 COLONIES/mL ESCHERICHIA COLI (A)  Final   Report Status 01/21/2022 FINAL  Final   Organism ID, Bacteria ESCHERICHIA COLI (A)  Final      Susceptibility   Escherichia coli -  MIC*    AMPICILLIN <=2 SENSITIVE Sensitive     CEFAZOLIN <=4 SENSITIVE Sensitive     CEFEPIME <=0.12 SENSITIVE Sensitive     CEFTRIAXONE <=0.25 SENSITIVE Sensitive     CIPROFLOXACIN <=0.25 SENSITIVE Sensitive     GENTAMICIN <=1 SENSITIVE Sensitive     IMIPENEM <=0.25 SENSITIVE Sensitive     NITROFURANTOIN 32 SENSITIVE Sensitive     TRIMETH/SULFA <=20 SENSITIVE Sensitive     AMPICILLIN/SULBACTAM <=2 SENSITIVE Sensitive     PIP/TAZO <=4 SENSITIVE Sensitive     * >=100,000 COLONIES/mL ESCHERICHIA COLI  Blood Culture ID Panel (Reflexed)     Status: Abnormal   Collection Time: 01/19/22  7:55 AM  Result Value Ref Range Status   Enterococcus faecalis NOT DETECTED NOT DETECTED Final   Enterococcus Faecium NOT DETECTED NOT DETECTED Final   Listeria monocytogenes NOT DETECTED NOT DETECTED Final   Staphylococcus species DETECTED (A) NOT DETECTED Final    Comment: CRITICAL RESULT CALLED TO, READ BACK BY AND VERIFIED WITH: RN 01/21/22 [phone] @1411  FH    Staphylococcus aureus (BCID) NOT DETECTED NOT DETECTED Final   Staphylococcus epidermidis NOT DETECTED NOT DETECTED Final   Staphylococcus lugdunensis NOT DETECTED NOT DETECTED Final   Streptococcus species NOT DETECTED NOT DETECTED Final   Streptococcus agalactiae NOT DETECTED NOT DETECTED Final   Streptococcus  pneumoniae NOT DETECTED NOT DETECTED Final   Streptococcus pyogenes NOT DETECTED NOT DETECTED Final   A.calcoaceticus-baumannii NOT DETECTED NOT DETECTED Final   Bacteroides fragilis NOT DETECTED NOT DETECTED Final   Enterobacterales NOT DETECTED NOT DETECTED Final   Enterobacter cloacae complex NOT DETECTED NOT DETECTED Final   Escherichia coli NOT DETECTED NOT DETECTED Final   Klebsiella aerogenes NOT DETECTED NOT DETECTED Final   Klebsiella oxytoca NOT DETECTED NOT DETECTED Final   Klebsiella pneumoniae NOT DETECTED NOT DETECTED Final   Proteus species NOT DETECTED NOT DETECTED Final   Salmonella species NOT DETECTED NOT DETECTED  Final   Serratia marcescens NOT DETECTED NOT DETECTED Final   Haemophilus influenzae NOT DETECTED NOT DETECTED Final   Neisseria meningitidis NOT DETECTED NOT DETECTED Final   Pseudomonas aeruginosa NOT DETECTED NOT DETECTED Final   Stenotrophomonas maltophilia NOT DETECTED NOT DETECTED Final   Candida albicans NOT DETECTED NOT DETECTED Final   Candida auris NOT DETECTED NOT DETECTED Final   Candida glabrata NOT DETECTED NOT DETECTED Final   Candida krusei NOT DETECTED NOT DETECTED Final   Candida parapsilosis NOT DETECTED NOT DETECTED Final   Candida tropicalis NOT DETECTED NOT DETECTED Final   Cryptococcus neoformans/gattii NOT DETECTED NOT DETECTED Final    Comment: Performed at [facility], [address] [place], [place] [address]  SARS Coronavirus 2 by RT PCR (hospital order, performed in [facility] hospital lab) *cepheid single result test* Anterior Nasal Swab     Status: None   Collection Time: 01/19/22  7:59 AM   Specimen: Anterior Nasal Swab  Result Value Ref Range Status   SARS Coronavirus 2 by RT PCR NEGATIVE NEGATIVE Final    Comment: (NOTE) SARS-CoV-2 target nucleic acids are NOT DETECTED.  The SARS-CoV-2 RNA is generally detectable in upper and lower respiratory specimens during the acute phase of infection. The lowest concentration of SARS-CoV-2 viral copies this assay can detect is 250 copies / mL. A negative result does not preclude SARS-CoV-2 infection and should not be used as the sole basis for treatment or other patient management decisions.  A negative result may occur with improper specimen collection / handling, submission of specimen other than nasopharyngeal swab, presence of viral mutation(s) within the areas targeted by this assay, and inadequate number of viral copies (<250 copies / mL). A negative result must be combined with clinical observations, patient history, and epidemiological information.  Fact Sheet for Patients:    RoadLapTop.co.za  Fact Sheet for Healthcare Providers: http://kim-miller.com/  This test is not yet approved or  cleared by the [place] FDA and has been authorized for detection and/or diagnosis of SARS-CoV-2 by FDA under an Emergency Use Authorization (EUA).  This EUA will remain in effect (meaning this test can be used) for the duration of the COVID-19 declaration under Section 564(b)(1) of the Act, 21 U.S.C. section 360bbb-3(b)(1), unless the authorization is terminated or revoked sooner.  Performed at [facility], [address] [place], [place] [address]   Blood Culture (routine x 2)     Status: None (Preliminary result)   Collection Time: 01/19/22  9:24 AM   Specimen: BLOOD  Result Value Ref Range Status   Specimen Description BLOOD BLOOD RIGHT WRIST  Final   Special Requests   Final    Blood Culture adequate volume BOTTLES DRAWN AEROBIC AND ANAEROBIC   Culture   Final    NO GROWTH 4 DAYS Performed at [facility], [address], [place], [place] [address]  Report Status PENDING  Incomplete    [x]  Treated with clindamycin for aspiration pna in the setting of drug overdose and penicillin allergy. WBC 10.8, Temp 101.3 >> 98.1 prior to discharge. LA 1.4.   Blood culture finalized with 1/4 positive for Staph hominis, which is considered to be likely contaminant.   Noted previous attempts to contact pt have failed. Please attempt one more time to assess for s/sx of UTI and/or bacteremia.   If asymptomatic, CONTINUE clindamycin as prescribed If symptomatic for UTI, please call in prescription for MacroBid as per previous note on 7/16 If symptomatic for bactermia, have patient return to ED for evaluation  ED Provider: 8/16, MD   Marianna Fuss 01/23/2022, 9:32 AM Clinical Pharmacist Monday - Friday phone -  [phone] Saturday - Sunday phone - [phone]

## 2022-01-24 LAB — CULTURE, BLOOD (ROUTINE X 2)
Culture: NO GROWTH
Special Requests: ADEQUATE

## 2022-02-01 ENCOUNTER — Telehealth: Payer: Self-pay

## 2022-02-01 NOTE — Telephone Encounter (Signed)
Post ED Visit - Positive Culture Follow-up: Successful Patient Follow-Up  Culture assessed and recommendations reviewed by:  [x]  , Pharm.D. []  Delmar Landau, Pharm.D., BCPS AQ-ID []  , Pharm.D., BCPS []  Celedonio Miyamoto, Pharm.D., BCPS []  [place], Garvin Fila.D., BCPS, AAHIVP []  , Pharm.D., BCPS, AAHIVP []  Georgina Pillion, PharmD, BCPS []  , PharmD, BCPS []  [place], PharmD, BCPS []  [address], PharmD  Positive urine and blood culture  []  Patient discharged without antimicrobial prescription and treatment is now indicated [x]  Organism is resistant to prescribed ED discharge antimicrobial []  Patient with positive blood cultures  Pt returned call for symptoms check. Pt states she is still having UTI symptoms, foul smell, burning with urination, cloudy urine. Pt states she has been treated recently for Pneumonia. Pt not having any other symptoms, fevers, or s/s of bactermia/sepsis   Changes discussed with ED provider: , DO New antibiotic prescription Macrobid 100 mg po BID x 5 days Called to [facility] in [place],  Contacted patient, date 02/01/22, time 0845   02/01/2022, 8:48 AM

## 2022-03-22 ENCOUNTER — Other Ambulatory Visit: Payer: Self-pay

## 2022-03-22 ENCOUNTER — Encounter (HOSPITAL_COMMUNITY): Payer: Self-pay | Admitting: Emergency Medicine

## 2022-03-22 ENCOUNTER — Inpatient Hospital Stay (HOSPITAL_COMMUNITY)
Admission: EM | Admit: 2022-03-22 | Discharge: 2022-03-23 | DRG: 917 | Disposition: A | Discharge status: Home / Self Care | Payer: Self-pay | Attending: Internal Medicine | Admitting: Internal Medicine

## 2022-03-22 ENCOUNTER — Emergency Department (HOSPITAL_COMMUNITY): Payer: Self-pay

## 2022-03-22 DIAGNOSIS — F1410 Cocaine abuse, uncomplicated: Secondary | ICD-10-CM | POA: Diagnosis present

## 2022-03-22 DIAGNOSIS — Z87891 Personal history of nicotine dependence: Secondary | ICD-10-CM

## 2022-03-22 DIAGNOSIS — K219 Gastro-esophageal reflux disease without esophagitis: Secondary | ICD-10-CM | POA: Diagnosis present

## 2022-03-22 DIAGNOSIS — J9601 Acute respiratory failure with hypoxia: Secondary | ICD-10-CM | POA: Diagnosis present

## 2022-03-22 DIAGNOSIS — J9602 Acute respiratory failure with hypercapnia: Secondary | ICD-10-CM | POA: Diagnosis present

## 2022-03-22 DIAGNOSIS — F4321 Adjustment disorder with depressed mood: Secondary | ICD-10-CM | POA: Diagnosis present

## 2022-03-22 DIAGNOSIS — T40411A Poisoning by fentanyl or fentanyl analogs, accidental (unintentional), initial encounter: Principal | ICD-10-CM | POA: Diagnosis present

## 2022-03-22 DIAGNOSIS — M1990 Unspecified osteoarthritis, unspecified site: Secondary | ICD-10-CM | POA: Diagnosis present

## 2022-03-22 DIAGNOSIS — Z885 Allergy status to narcotic agent status: Secondary | ICD-10-CM

## 2022-03-22 DIAGNOSIS — Z886 Allergy status to analgesic agent status: Secondary | ICD-10-CM

## 2022-03-22 DIAGNOSIS — Z880 Allergy status to penicillin: Secondary | ICD-10-CM

## 2022-03-22 DIAGNOSIS — Z7982 Long term (current) use of aspirin: Secondary | ICD-10-CM

## 2022-03-22 DIAGNOSIS — N390 Urinary tract infection, site not specified: Secondary | ICD-10-CM | POA: Diagnosis present

## 2022-03-22 DIAGNOSIS — Z888 Allergy status to other drugs, medicaments and biological substances status: Secondary | ICD-10-CM

## 2022-03-22 DIAGNOSIS — Z96651 Presence of right artificial knee joint: Secondary | ICD-10-CM | POA: Diagnosis present

## 2022-03-22 DIAGNOSIS — Z90710 Acquired absence of both cervix and uterus: Secondary | ICD-10-CM

## 2022-03-22 DIAGNOSIS — I10 Essential (primary) hypertension: Secondary | ICD-10-CM | POA: Diagnosis present

## 2022-03-22 DIAGNOSIS — Z79899 Other long term (current) drug therapy: Secondary | ICD-10-CM

## 2022-03-22 DIAGNOSIS — G929 Unspecified toxic encephalopathy: Secondary | ICD-10-CM | POA: Diagnosis present

## 2022-03-22 DIAGNOSIS — T50901A Poisoning by unspecified drugs, medicaments and biological substances, accidental (unintentional), initial encounter: Secondary | ICD-10-CM | POA: Diagnosis present

## 2022-03-22 DIAGNOSIS — F419 Anxiety disorder, unspecified: Secondary | ICD-10-CM | POA: Diagnosis present

## 2022-03-22 DIAGNOSIS — Z9049 Acquired absence of other specified parts of digestive tract: Secondary | ICD-10-CM

## 2022-03-22 LAB — I-STAT ARTERIAL BLOOD GAS, ED
Acid-base deficit: 2 mmol/L (ref 0.0–2.0)
Bicarbonate: 25.8 mmol/L (ref 20.0–28.0)
Calcium, Ion: 1.25 mmol/L (ref 1.15–1.40)
HCT: 40 % (ref 36.0–46.0)
Hemoglobin: 13.6 g/dL (ref 12.0–15.0)
O2 Saturation: 84 %
Patient temperature: 98.9
Potassium: 4 mmol/L (ref 3.5–5.1)
Sodium: 139 mmol/L (ref 135–145)
TCO2: 27 mmol/L (ref 22–32)
pCO2 arterial: 55.7 mmHg — ABNORMAL HIGH (ref 32–48)
pH, Arterial: 7.275 — ABNORMAL LOW (ref 7.35–7.45)
pO2, Arterial: 56 mmHg — ABNORMAL LOW (ref 83–108)

## 2022-03-22 LAB — RAPID URINE DRUG SCREEN, HOSP PERFORMED
Amphetamines: POSITIVE — AB
Barbiturates: NOT DETECTED
Benzodiazepines: NOT DETECTED
Cocaine: POSITIVE — AB
Opiates: POSITIVE — AB
Tetrahydrocannabinol: NOT DETECTED

## 2022-03-22 LAB — CBC
HCT: 47.3 % — ABNORMAL HIGH (ref 36.0–46.0)
Hemoglobin: 14.5 g/dL (ref 12.0–15.0)
MCH: 26.1 pg (ref 26.0–34.0)
MCHC: 30.7 g/dL (ref 30.0–36.0)
MCV: 85.1 fL (ref 80.0–100.0)
Platelets: 249 10*3/uL (ref 150–400)
RBC: 5.56 MIL/uL — ABNORMAL HIGH (ref 3.87–5.11)
RDW: 15.1 % (ref 11.5–15.5)
WBC: 22.3 10*3/uL — ABNORMAL HIGH (ref 4.0–10.5)
nRBC: 0 % (ref 0.0–0.2)

## 2022-03-22 LAB — CBC WITH DIFFERENTIAL/PLATELET
Abs Immature Granulocytes: 0.09 10*3/uL — ABNORMAL HIGH (ref 0.00–0.07)
Basophils Absolute: 0.1 10*3/uL (ref 0.0–0.1)
Basophils Relative: 0 %
Eosinophils Absolute: 0.1 10*3/uL (ref 0.0–0.5)
Eosinophils Relative: 0 %
HCT: 45.3 % (ref 36.0–46.0)
Hemoglobin: 14.1 g/dL (ref 12.0–15.0)
Immature Granulocytes: 0 %
Lymphocytes Relative: 11 %
Lymphs Abs: 2.3 10*3/uL (ref 0.7–4.0)
MCH: 26.2 pg (ref 26.0–34.0)
MCHC: 31.1 g/dL (ref 30.0–36.0)
MCV: 84.2 fL (ref 80.0–100.0)
Monocytes Absolute: 0.7 10*3/uL (ref 0.1–1.0)
Monocytes Relative: 3 %
Neutro Abs: 17.9 10*3/uL — ABNORMAL HIGH (ref 1.7–7.7)
Neutrophils Relative %: 86 %
Platelets: 283 10*3/uL (ref 150–400)
RBC: 5.38 MIL/uL — ABNORMAL HIGH (ref 3.87–5.11)
RDW: 15.1 % (ref 11.5–15.5)
WBC: 21.2 10*3/uL — ABNORMAL HIGH (ref 4.0–10.5)
nRBC: 0 % (ref 0.0–0.2)

## 2022-03-22 LAB — I-STAT BETA HCG BLOOD, ED (MC, WL, AP ONLY): I-stat hCG, quantitative: <5 m[IU]/mL (ref <5)

## 2022-03-22 LAB — BASIC METABOLIC PANEL
Anion gap: 7 (ref 5–15)
BUN: 10 mg/dL (ref 6–20)
CO2: 26 mmol/L (ref 22–32)
Calcium: 9 mg/dL (ref 8.9–10.3)
Chloride: 106 mmol/L (ref 98–111)
Creatinine, Ser: 1.08 mg/dL — ABNORMAL HIGH (ref 0.44–1.00)
GFR, Estimated: >60 mL/min (ref >60)
Glucose, Bld: 154 mg/dL — ABNORMAL HIGH (ref 70–99)
Potassium: 3.8 mmol/L (ref 3.5–5.1)
Sodium: 139 mmol/L (ref 135–145)

## 2022-03-22 LAB — I-STAT VENOUS BLOOD GAS, ED
Acid-base deficit: 1 mmol/L (ref 0.0–2.0)
Bicarbonate: 28.7 mmol/L — ABNORMAL HIGH (ref 20.0–28.0)
Calcium, Ion: 1.19 mmol/L (ref 1.15–1.40)
HCT: 45 % (ref 36.0–46.0)
Hemoglobin: 15.3 g/dL — ABNORMAL HIGH (ref 12.0–15.0)
O2 Saturation: 57 %
Potassium: 4 mmol/L (ref 3.5–5.1)
Sodium: 140 mmol/L (ref 135–145)
TCO2: 31 mmol/L (ref 22–32)
pCO2, Ven: 68.7 mmHg — ABNORMAL HIGH (ref 44–60)
pH, Ven: 7.228 — ABNORMAL LOW (ref 7.25–7.43)
pO2, Ven: 36 mmHg (ref 32–45)

## 2022-03-22 LAB — PROCALCITONIN: Procalcitonin: <0.1 ng/mL

## 2022-03-22 LAB — MAGNESIUM: Magnesium: 1.9 mg/dL (ref 1.7–2.4)

## 2022-03-22 LAB — LACTIC ACID, PLASMA
Lactic Acid, Venous: 1.5 mmol/L (ref 0.5–1.9)
Lactic Acid, Venous: 1.3 mmol/L (ref 0.5–1.9)
Lactic Acid, Venous: 2.6 mmol/L (ref 0.5–1.9)

## 2022-03-22 LAB — HIV ANTIBODY (ROUTINE TESTING W REFLEX): HIV Screen 4th Generation wRfx: NONREACTIVE

## 2022-03-22 LAB — MRSA NEXT GEN BY PCR, NASAL: MRSA by PCR Next Gen: NOT DETECTED

## 2022-03-22 LAB — GLUCOSE, CAPILLARY: Glucose-Capillary: 131 mg/dL — ABNORMAL HIGH (ref 70–99)

## 2022-03-22 LAB — PHOSPHORUS: Phosphorus: 4 mg/dL (ref 2.5–4.6)

## 2022-03-22 LAB — CREATININE, SERUM
Creatinine, Ser: 0.96 mg/dL (ref 0.44–1.00)
GFR, Estimated: >60 mL/min (ref >60)

## 2022-03-22 MED ORDER — POLYETHYLENE GLYCOL 3350 17 G PO PACK: 17.0000 g | PACK | Freq: Every day | ORAL | Status: DC | PRN

## 2022-03-22 MED ORDER — CHLORHEXIDINE GLUCONATE CLOTH 2 % EX PADS
6.0000 | MEDICATED_PAD | Freq: Every day | CUTANEOUS | Status: DC
  Administered 2022-03-23: 6 via TOPICAL

## 2022-03-22 MED ORDER — NALOXONE HCL 0.4 MG/ML IJ SOLN
0.4000 mg | Freq: Once | INTRAMUSCULAR | Status: AC
Start: 2022-03-22 — End: 2022-03-22
  Administered 2022-03-22: 0.4 mg via INTRAVENOUS
  Filled 2022-03-22: qty 1

## 2022-03-22 MED ORDER — ENOXAPARIN SODIUM 40 MG/0.4ML IJ SOSY
40.0000 mg | PREFILLED_SYRINGE | Freq: Every day | INTRAMUSCULAR | Status: DC
  Administered 2022-03-22: 40 mg via SUBCUTANEOUS
  Filled 2022-03-22 (×2): qty 0.4

## 2022-03-22 MED ORDER — NALOXONE HCL 4 MG/10ML IJ SOLN
0.1250 mg/h | INTRAVENOUS | Status: DC
  Administered 2022-03-22: 0.25 mg/h via INTRAVENOUS
  Filled 2022-03-22 (×2): qty 10

## 2022-03-22 MED ORDER — IPRATROPIUM-ALBUTEROL 0.5-2.5 (3) MG/3ML IN SOLN
3.0000 mL | Freq: Once | RESPIRATORY_TRACT | Status: AC
  Administered 2022-03-22: 3 mL via RESPIRATORY_TRACT
  Filled 2022-03-22: qty 3

## 2022-03-22 MED ORDER — HYDRALAZINE HCL 20 MG/ML IJ SOLN
10.0000 mg | INTRAMUSCULAR | Status: DC | PRN
  Administered 2022-03-22: 10 mg via INTRAVENOUS
  Filled 2022-03-22: qty 1

## 2022-03-22 MED ORDER — SODIUM CHLORIDE 0.9 % IV BOLUS
500.0000 mL | Freq: Once | INTRAVENOUS | Status: AC
  Administered 2022-03-22: 500 mL via INTRAVENOUS

## 2022-03-22 MED ORDER — LACTATED RINGERS IV SOLN: INTRAVENOUS | Status: DC

## 2022-03-22 MED ORDER — NALOXONE HCL 0.4 MG/ML IJ SOLN
0.4000 mg | Freq: Once | INTRAMUSCULAR | Status: AC
  Administered 2022-03-22: 0.4 mg via INTRAVENOUS
  Filled 2022-03-22: qty 1

## 2022-03-22 MED ORDER — SODIUM CHLORIDE 0.9 % IV SOLN: INTRAVENOUS | Status: DC

## 2022-03-22 MED ORDER — LEVOFLOXACIN IN D5W 750 MG/150ML IV SOLN
750.0000 mg | Freq: Once | INTRAVENOUS | Status: AC
  Administered 2022-03-22: 750 mg via INTRAVENOUS
  Filled 2022-03-22: qty 150

## 2022-03-22 MED ORDER — DOCUSATE SODIUM 100 MG PO CAPS: 100.0000 mg | ORAL_CAPSULE | Freq: Two times a day (BID) | ORAL | Status: DC | PRN

## 2022-03-22 MED ORDER — LEVOFLOXACIN IN D5W 750 MG/150ML IV SOLN
750.0000 mg | INTRAVENOUS | Status: DC
  Filled 2022-03-22: qty 150

## 2022-03-22 NOTE — ED Provider Notes (Signed)
[facility] EMERGENCY DEPARTMENT Provider Note   CSN: [id number] Arrival date & time: 03/22/22  7948     History  Chief Complaint  Patient presents with   Drug Overdose    Cheryl Fox is a 41 y.o. female.  41 year old female with prior medical history as detailed below presents for evaluation.  Patient was found in [place] [employer] [place] bathroom.  EMS was called out to 2 unresponsive females in the bathroom.  Both patients were apparently overdosed on narcotic.  The patient's companion improved after administration of Narcan and then refused transport.  EMS was advised that they had taken something that they thought was Percocet.  The patient was given a total of 4 mg of Narcan intranasally.  Patient was being bagged by fire when EMS arrived.  Patient did vomit during bag-valve-mask ventilations.  EMS is very suspicious of likely aspiration.  Patient is alert but confused upon arrival.  She is covered with vomit.  She is coughing up thick secretions.  The history is provided by the patient and medical records.       Home Medications Prior to Admission medications   Medication Sig Start Date End Date Taking? Authorizing Provider  aspirin EC 325 MG EC tablet Take 1 tablet (325 mg total) by mouth daily with breakfast. Patient not taking: Reported on 01/19/2022 09/20/18   Rayburn, Fanny Bien, PA-C  clindamycin (CLEOCIN) 150 MG capsule Take 1 capsule (150 mg total) by mouth every 6 (six) hours. 01/19/22   Terrilee Files, MD  diphenhydrAMINE (BENADRYL) 12.5 MG/5ML elixir Take 5-10 mLs (12.5-25 mg total) by mouth every 4 (four) hours as needed for itching. Patient not taking: Reported on 01/19/2022 09/20/18   Rayburn, Fanny Bien, PA-C  docusate sodium (COLACE) 100 MG capsule Take 1 capsule (100 mg total) by mouth 2 (two) times daily. Patient not taking: Reported on 01/19/2022 09/20/18   Rayburn, Fanny Bien, PA-C  methocarbamol (ROBAXIN) 500 MG tablet Take 1  tablet (500 mg total) by mouth every 6 (six) hours as needed for muscle spasms. Patient not taking: Reported on 01/19/2022 09/20/18   Rayburn, Fanny Bien, PA-C  oxyCODONE-acetaminophen (PERCOCET/ROXICET) 5-325 MG tablet Take 1 tablet by mouth every 6 (six) hours as needed for severe pain. Patient not taking: Reported on 01/19/2022 01/01/19   Nadara Mustard, MD  polyethylene glycol [facility] / Ethelene Hal) packet Take 17 g by mouth daily as needed for mild constipation. Patient not taking: Reported on 01/19/2022 09/20/18   Rayburn, Fanny Bien, PA-C      Allergies    Celecoxib, Naproxen, Penicillins, Prednisone, Tramadol, Hydrocodone-ibuprofen, and Motrin [ibuprofen]    Review of Systems   Review of Systems  All other systems reviewed and are negative.   Physical Exam Updated Vital Signs Pulse (!) 114   Temp 98.9 F (37.2 C) (Oral)   Resp 17   LMP 08/10/2014 (Approximate)   SpO2 96%  Physical Exam Vitals and nursing note reviewed.  Constitutional:      General: She is not in acute distress.    Appearance: She is well-developed.     Comments: Patient alert but somnolent.  Arousable easily with verbal stimulation.  Confused as to the events that led her to the ED today.  Covered with vomit.  HENT:     Head: Normocephalic and atraumatic.  Eyes:     Conjunctiva/sclera: Conjunctivae normal.     Pupils: Pupils are equal, round, and reactive to light.  Cardiovascular:     Rate  and Rhythm: Normal rate and regular rhythm.     Heart sounds: Normal heart sounds.  Pulmonary:     Effort: Pulmonary effort is normal. No respiratory distress.     Comments: Decreased breath sounds at right base Abdominal:     General: There is no distension.     Palpations: Abdomen is soft.     Tenderness: There is no abdominal tenderness.  Musculoskeletal:        General: No deformity. Normal range of motion.     Cervical back: Normal range of motion and neck supple.  Skin:    General: Skin is warm  and dry.  Neurological:     General: No focal deficit present.     Mental Status: She is disoriented.     Cranial Nerves: No cranial nerve deficit.     Sensory: No sensory deficit.     Motor: No weakness.     Coordination: Coordination normal.     ED Results / Procedures / Treatments   Labs (all labs ordered are listed, but only abnormal results are displayed) Labs Reviewed  CULTURE, BLOOD (ROUTINE X 2)  CULTURE, BLOOD (ROUTINE X 2)  CBC WITH DIFFERENTIAL/PLATELET  BASIC METABOLIC PANEL  RAPID URINE DRUG SCREEN, HOSP PERFORMED  LACTIC ACID, PLASMA  LACTIC ACID, PLASMA  I-STAT BETA HCG BLOOD, ED ([facility], [facility], [facility] ONLY)  I-STAT VENOUS BLOOD GAS, ED    EKG EKG Interpretation  Date/Time:  Wednesday March 22 2022 08:58:42 EDT Ventricular Rate:  116 PR Interval:  142 QRS Duration: 97 QT Interval:  352 QTC Calculation: 489 R Axis:   71 Text Interpretation: Sinus tachycardia Probable anteroseptal infarct, old Nonspecific T abnormalities, lateral leads Confirmed by Kristine Royal [phone]) on 03/22/2022 9:01:57 AM  Radiology No results found.  Procedures Procedures    Medications Ordered in ED Medications  levofloxacin (LEVAQUIN) IVPB 750 mg (has no administration in time range)  naloxone [facility]) injection 0.4 mg (0.4 mg Intravenous Given 03/22/22 0851)  sodium chloride 0.9 % bolus 500 mL (500 mLs Intravenous New Bag/Given 03/22/22 0902)    ED Course/ Medical Decision Making/ A&P                           Medical Decision Making Amount and/or Complexity of Data Reviewed Labs: ordered. Radiology: ordered.  Risk Prescription drug management.    Medical Screen Complete  This patient presented to the ED with complaint of overdose.  This complaint involves an extensive number of treatment options. The initial differential diagnosis includes, but is not limited to, overdose, aspiration, metabolic abnormality, etc.  This presentation is: Acute, Self-Limited,  Previously Undiagnosed, Uncertain Prognosis, Complicated, Systemic Symptoms, and Threat to Life/Bodily Function  Patient found by EMS after apparent overdose on narcotic.  Patient's ventilations were being assisted with BVM by fire department when EMS arrived.  Patient did vomit during BVM ventilation.  Patient with improved mentation and respiratory effort after administration of 4 mg Narcan intranasally in the field.  Patient with obvious/likely aspiration event.  Improvement in mentation and respiratory effort noted initially administration of point for Narcan on arrival.  After approximately 1 hour patient with increased somnolence and mildly worsening ventilation.  Additional Narcan ordered and Narcan drip initiated. Again, improvement noted with Narcan.  Critical care made aware of case.  They will evaluate the patient in the ED.  1030 -Critical care plans to admit.     Additional history obtained:  External records from outside  sources obtained and reviewed including prior ED visits and prior Inpatient records.    Lab Tests:  I ordered and personally interpreted labs.  The pertinent results include: CBC, BMP, i-STAT hCG, i-STAT venous gas, lactic acid, urine drug screen   Imaging Studies ordered:  I ordered imaging studies including chest x-ray I independently visualized and interpreted obtained imaging which showed NAD I agree with the radiologist interpretation.   Cardiac Monitoring:  The patient was maintained on a cardiac monitor.  I personally viewed and interpreted the cardiac monitor which showed an underlying rhythm of: Sinus tach   Medicines ordered:  I ordered medication including Narcan for overdose Reevaluation of the patient after these medicines showed that the patient: improved   Problem List / ED Course:  Overdose, aspiration event   Reevaluation:  After the interventions noted above, I reevaluated the patient and found that they have:  improved   Disposition:  After consideration of the diagnostic results and the patients response to treatment, I feel that the patent would benefit from admission.    CRITICAL CARE Performed by: Wynetta Fines   Total critical care time: 30 minutes  Critical care time was exclusive of separately billable procedures and treating other patients.  Critical care was necessary to treat or prevent imminent or life-threatening deterioration.  Critical care was time spent personally by me on the following activities: development of treatment plan with patient and/or surrogate as well as nursing, discussions with consultants, evaluation of patient's response to treatment, examination of patient, obtaining history from patient or surrogate, ordering and performing treatments and interventions, ordering and review of laboratory studies, ordering and review of radiographic studies, pulse oximetry and re-evaluation of patient's condition.         Final Clinical Impression(s) / ED Diagnoses Final diagnoses:  None    Rx / DC Orders ED Discharge Orders     None         Wynetta Fines, MD 03/22/22 1034

## 2022-03-22 NOTE — ED Triage Notes (Signed)
Pt arrived via [facility] from [employer] station. Per EMS pt was found in gas station bathroom as an OD on unknown substance. Pt bagged by fire.Pt had some N/V x1.    4mg  Narcan 85-90% NRB, 110HR, 200/130

## 2022-03-22 NOTE — Progress Notes (Signed)
eLink Physician-Brief Progress Note Patient Name: Cheryl Fox DOB: [birth year] MRN: [id number]   Date of Service  03/22/2022  HPI/Events of Note  Received request for diet order.   Pt admitted for respiratory failure in setting of drug overdose with fentanyl and cocaine.  RN reports that she is more awake and following commands now.  Pt has no difficulty swallowing.  eICU Interventions  Regular diet ordered.     Intervention Category Minor Interventions: Routine modifications to care plan (e.g. PRN medications for pain, fever)  Larinda Buttery 03/22/2022, 8:09 PM

## 2022-03-22 NOTE — ED Notes (Signed)
Pt linen changed, cleaned and changed out of personal clothing.

## 2022-03-22 NOTE — H&P (Signed)
NAME:  Cheryl Fox, MRN:  [id number], DOB:  02-Mar-1981, LOS: 0 ADMISSION DATE:  03/22/2022, CONSULTATION DATE: 03/22/2022 REFERRING MD: Emergency department physician, CHIEF COMPLAINT: Drug overdose  History of Present Illness:  41 year old female who was found with her partner gas station bathroom unconscious.  Received multiple doses of Narcan with improved mental status but still narcotize.  As reported she was doing fentanyl and cocaine.  It was reported that she vomited while being bagged by EMS.  Chest x-ray is unremarkable.  She will be admitted to the intensive care unit placed on a Narcan drip supplemental oxygen will be supplied and should be continuously evaluated for at least 24 hours.  Pertinent  Medical History   Past Medical History:  Diagnosis Date   Acid reflux    Anxiety    Arthritis    Complication of anesthesia    after hysterectomy, BP was very high   Constipation due to pain medication    Depression    Hepatitis C    treated with [place]   Hypertension      Significant Hospital Events: Including procedures, antibiotic start and stop dates in addition to other pertinent events   Admitted to intensive care unit for overdose with possible aspiration  Interim History / Subjective:  Required multiple doses of Narcan and still remains narcotize  Objective   Blood pressure (!) 181/106, pulse (!) 107, temperature 98.9 F (37.2 C), temperature source Oral, resp. rate 14, last menstrual period 08/10/2014, SpO2 91 %.       No intake or output data in the 24 hours ending 03/22/22 1054 There were no vitals filed for this visit.  Examination: General: Morbid obese female who is obviously intoxicated HENT: No JVD or lymphadenopathy is appreciated Lungs: Surprisingly clear diminished breath sounds in the bases Cardiovascular: Heart sounds irregular regular rate rhythm Abdomen: Obese soft nontender Extremities: Right knee shows scars from knee replacement 1+  edema Neuro: Intoxicated but wakes up and follows commands and answers questions  GU: [facility] Problem list     Assessment & Plan:  Hypercarbic/hypoxic respiratory failure in the setting of overdose of fentanyl with concurrent use of cocaine.  Also component of aspiration as she was being bagged while she was vomiting. Admit to the intensive care unit for least 24 hours Narcan drip IV hydration Empirical antimicrobial therapy with her history of E. coli urine infection therefore Levaquin. Blood cultures and urine culture We will avoid intubation if we can utilize supplemental oxygen to keep sats greater than 90% Most likely be able to get him out of the intensive care unit within 24 hours and to try a service or discharge.  Substance abuse Counseling  Depression Consider psych consult prior to discharge     Best Practice (right click and "Reselect all SmartList Selections" daily)   Diet/type: NPO DVT prophylaxis: LMWH GI prophylaxis: PPI Lines: N/A Foley:  N/A Code Status:  full code Last date of multidisciplinary goals of care discussion [tbd]  Labs   CBC: Recent Labs  Lab 03/22/22 0905 03/22/22 1022  HGB 15.3* 13.6  HCT 45.0 [id number]    Basic Metabolic Panel: Recent Labs  Lab 03/22/22 0905 03/22/22 1022  NA 140 139  K 4.0 4.0   GFR: CrCl cannot be calculated (Patient's most recent lab result is older than the maximum 21 days allowed.). Recent Labs  Lab 03/22/22 0857  LATICACIDVEN 1.5    Liver Function Tests: No results for input(s): "AST", "ALT", "ALKPHOS", "  BILITOT", "PROT", "ALBUMIN" in the last 168 hours. No results for input(s): "LIPASE", "AMYLASE" in the last 168 hours. No results for input(s): "AMMONIA" in the last 168 hours.  ABG    Component Value Date/Time   PHART 7.275 (L) 03/22/2022 1022   PCO2ART 55.7 (H) 03/22/2022 1022   PO2ART 56 (L) 03/22/2022 1022   HCO3 25.8 03/22/2022 1022   TCO2 27 03/22/2022 1022    ACIDBASEDEF 2.0 03/22/2022 1022   O2SAT 84 03/22/2022 1022     Coagulation Profile: No results for input(s): "INR", "PROTIME" in the last 168 hours.  Cardiac Enzymes: No results for input(s): "CKTOTAL", "CKMB", "CKMBINDEX", "TROPONINI" in the last 168 hours.  HbA1C: No results found for: "HGBA1C"  CBG: No results for input(s): "GLUCAP" in the last 168 hours.  Review of Systems:   na  Past Medical History:  She,  has a past medical history of Acid reflux, Anxiety, Arthritis, Complication of anesthesia, Constipation due to pain medication, Depression, Hepatitis C, and Hypertension.   Surgical History:   Past Surgical History:  Procedure Laterality Date   ABDOMINAL HYSTERECTOMY     APPLICATION OF WOUND VAC Right 09/18/2018   Procedure: Application Of Wound Vac;  Surgeon: Nadara Mustard, MD;  Location: [facility] OR;  Service: Orthopedics;  Laterality: Right;   CHOLECYSTECTOMY     TOTAL KNEE ARTHROPLASTY Right 09/18/2018   Procedure: RIGHT TOTAL KNEE ARTHROPLASTY;  Surgeon: Nadara Mustard, MD;  Location: [facility] OR;  Service: Orthopedics;  Laterality: Right;     Social History:   reports that she quit smoking about 3 years ago. Her smoking use included cigarettes. She smoked an average of .5 packs per day. She has never used smokeless tobacco. She reports that she does not drink alcohol and does not use drugs.   Family History:  Her family history is not on file.   Allergies Allergies  Allergen Reactions   Celecoxib Hives and Nausea Only   Naproxen Other (See Comments) and Rash    Very bad headache    Penicillins Hives, Nausea And Vomiting and Rash    Has patient had a PCN reaction causing immediate rash, facial/tongue/throat swelling, SOB or lightheadedness with hypotension: Unknown Has patient had a PCN reaction causing severe rash involving mucus membranes or skin necrosis: Unknown Has patient had a PCN reaction that required hospitalization: Unknown Has patient had a PCN reaction  occurring within the last 10 years: Yes - has had  If all of the above answers are "NO", then may proceed with Cephalosporin use.    Prednisone Hives, Nausea Only and Other (See Comments)    Makes her angry    Tramadol Nausea And Vomiting and Other (See Comments)    Very bad headache    Hydrocodone-Ibuprofen Nausea And Vomiting and Rash   Motrin [Ibuprofen] Other (See Comments)    Stomach irritation     Home Medications  Prior to Admission medications   Medication Sig Start Date End Date Taking? Authorizing Provider  aspirin EC 325 MG EC tablet Take 1 tablet (325 mg total) by mouth daily with breakfast. Patient not taking: Reported on 01/19/2022 09/20/18   Rayburn, Fanny Bien, PA-C  clindamycin (CLEOCIN) 150 MG capsule Take 1 capsule (150 mg total) by mouth every 6 (six) hours. 01/19/22   Terrilee Files, MD  diphenhydrAMINE (BENADRYL) 12.5 MG/5ML elixir Take 5-10 mLs (12.5-25 mg total) by mouth every 4 (four) hours as needed for itching. Patient not taking: Reported on 01/19/2022 09/20/18   Rayburn,  [name] Montgomery, PA-C  docusate sodium (COLACE) 100 MG capsule Take 1 capsule (100 mg total) by mouth 2 (two) times daily. Patient not taking: Reported on 01/19/2022 09/20/18   Rayburn, Fanny Bien, PA-C  methocarbamol (ROBAXIN) 500 MG tablet Take 1 tablet (500 mg total) by mouth every 6 (six) hours as needed for muscle spasms. Patient not taking: Reported on 01/19/2022 09/20/18   Rayburn, Fanny Bien, PA-C  oxyCODONE-acetaminophen (PERCOCET/ROXICET) 5-325 MG tablet Take 1 tablet by mouth every 6 (six) hours as needed for severe pain. Patient not taking: Reported on 01/19/2022 01/01/19   Nadara Mustard, MD  polyethylene glycol [facility] / Ethelene Hal) packet Take 17 g by mouth daily as needed for mild constipation. Patient not taking: Reported on 01/19/2022 09/20/18   Rayburn, Fanny Bien, PA-C     Critical care time: 45 min      Brett Canales Miciah Shealy ACNP Acute Care Nurse  Practitioner [facility] Pulmonary/Critical Care Please consult Amion 03/22/2022, 10:54 AM

## 2022-03-23 LAB — CBC
HCT: 38.1 % (ref 36.0–46.0)
Hemoglobin: 12.3 g/dL (ref 12.0–15.0)
MCH: 26.5 pg (ref 26.0–34.0)
MCHC: 32.3 g/dL (ref 30.0–36.0)
MCV: 82.1 fL (ref 80.0–100.0)
Platelets: 194 10*3/uL (ref 150–400)
RBC: 4.64 MIL/uL (ref 3.87–5.11)
RDW: 15.1 % (ref 11.5–15.5)
WBC: 25.5 10*3/uL — ABNORMAL HIGH (ref 4.0–10.5)
nRBC: 0 % (ref 0.0–0.2)

## 2022-03-23 LAB — BASIC METABOLIC PANEL
Anion gap: 8 (ref 5–15)
BUN: 10 mg/dL (ref 6–20)
CO2: 24 mmol/L (ref 22–32)
Calcium: 8.8 mg/dL — ABNORMAL LOW (ref 8.9–10.3)
Chloride: 104 mmol/L (ref 98–111)
Creatinine, Ser: 0.76 mg/dL (ref 0.44–1.00)
GFR, Estimated: >60 mL/min (ref >60)
Glucose, Bld: 128 mg/dL — ABNORMAL HIGH (ref 70–99)
Potassium: 4 mmol/L (ref 3.5–5.1)
Sodium: 136 mmol/L (ref 135–145)

## 2022-03-23 LAB — URINE CULTURE
Culture: <10000 — AB
Special Requests: NORMAL

## 2022-03-23 MED ORDER — CEPHALEXIN 500 MG PO CAPS
500.0000 mg | ORAL_CAPSULE | Freq: Three times a day (TID) | ORAL | Status: DC
  Administered 2022-03-23: 500 mg via ORAL
  Filled 2022-03-23 (×2): qty 1

## 2022-03-23 MED ORDER — CEPHALEXIN 500 MG PO CAPS: 500.0000 mg | ORAL_CAPSULE | Freq: Three times a day (TID) | ORAL | 0 refills | Status: AC

## 2022-03-23 NOTE — Progress Notes (Signed)
NAME:  Cheryl Fox, MRN:  [id number], DOB:  [birth year], LOS: 1 ADMISSION DATE:  03/22/2022, CONSULTATION DATE:  03/22/2022 REFERRING MD:  ED, CHIEF COMPLAINT:  Drug Overdose   History of Present Illness:  41 year old female who was found with her partner gas station bathroom unconscious.  Received multiple doses of Narcan with improved mental status but still narcotize.  As reported she was doing fentanyl and cocaine.  It was reported that she vomited while being bagged by EMS.  Chest x-ray is unremarkable.  She will be admitted to the intensive care unit placed on a Narcan drip supplemental oxygen will be supplied and should be continuously evaluated for at least 24 hours.  Pertinent  Medical History   Past Medical History:  Diagnosis Date   Acid reflux    Anxiety    Arthritis    Complication of anesthesia    after hysterectomy, BP was very high   Constipation due to pain medication    Depression    Hepatitis C    treated with [place]   Hypertension      Significant Hospital Events: Including procedures, antibiotic start and stop dates in addition to other pertinent events   9/13 admitted to ICU on narcan drip 9/14 narcan drip and O2 titrated down  Interim History / Subjective:  Pt is doing well this morning, she has no new or worsening complaints. Had a soft BM this morning. Still coughing up greenish sputum with some associated pleuritic pain. No cough prior to admission. She has been having issues with depression after losing her grandmother, mother, and brother recently. She states she took cocaine which she has tolerated well prior. She has taken opiates before after a surgery and denied any overuse or further rx or illicit opiate use. She has not been taking her amlodipine after thinking she didn't need it. She had diabetes while pregnant but has been normoglycemic since then. 20 year smoking history with 1/2 ppd until 6 months ago increase to 1 ppd.   Objective   Blood  pressure (!) 153/105, pulse (!) 102, temperature 98.1 F (36.7 C), temperature source Oral, resp. rate (!) 22, weight 109 kg, last menstrual period 08/10/2014, SpO2 98 %.        Intake/Output Summary (Last 24 hours) at 03/23/2022 0723 Last data filed at 03/23/2022 0500 Gross per 24 hour  Intake 2253.91 ml  Output 800 ml  Net 1453.91 ml   Filed Weights   03/23/22 0500  Weight: 109 kg    Examination: General: Obese female laying in bed, NAD HENT: [place]/AT, PERRL, EOM intact Lungs: Diffuse wheezes Cardiovascular: Tachycardia reg Abdomen: Soft, non-tender, non-distended, BS present Extremities: Minimal LE edema, Palpable radial and pedal pulses bilaterally Neuro: A/Ox3, motor/sensation grossly intact  Resolved Hospital Problem list     Assessment & Plan:  Acute hypercarbic/hypoxic respiratory failure 2/2 opiate overdose UTI Admitted after using cocaine likely laced with fentanyl. No history of opiate use disorder. Likely mild underlying lung disease with 10-11 pack year smoking history, currently 1+ ppd. Concern initially for aspiration pneumonia, leukocytosis worsening,no fever, procal neg. CXR clear. Blood/urine cx pending, no pre abx UA, hx of E. Coli in urine, given levaquin on admission. Responded well to narcan drip and supplemental O2 [place].  - titrate narcan to off with goal RR of 16 rpm - wean off supplemental O2 - Stop levaquin, start nitrofurantoin for 4 more days to cover UTI. Stop on 9/17 - If able to turn narcan off she will  be able to transfer out of the ICU or discharge home   Substance abuse Tobacco Use Disorder Depression/Grief Use of cocaine and increased tobacco use as a coping mechanism after losing grandmother, mother, and brother recently. Has a plan for counseling outpatient, shows initiative to address this after discharge. Appears to be on fluoxetine 10 mg daily outpatient. May still need psychiatric evaluation if determined not to be within the realm of normal  grief reaction.   Hypertension Pt was previously on amlodipine 5 mg but stopped taking this herself. Pressures are high her but this is clouded by cocaine use and acute presentation. Likely will need this added back after discharge.   Best Practice (right click and "Reselect all SmartList Selections" daily)   Diet/type: Regular consistency (see orders) DVT prophylaxis: LMWH GI prophylaxis: N/A Lines: N/A Foley:  N/A Code Status:  full code Last date of multidisciplinary goals of care discussion [9/14]  Labs   CBC: Recent Labs  Lab 03/22/22 0857 03/22/22 0905 03/22/22 1022 03/22/22 1148  WBC 21.2*  --   --  22.3*  NEUTROABS 17.9*  --   --   --   HGB 14.1 15.3* 13.6 14.5  HCT 45.3 45.0 40.0 47.3*  MCV 84.2  --   --  85.1  PLT 283  --   --  249    Basic Metabolic Panel: Recent Labs  Lab 03/22/22 0857 03/22/22 0905 03/22/22 1022 03/22/22 1148 03/23/22 0142  NA 139 140 139  --  136  K 3.8 4.0 4.0  --  4.0  CL 106  --   --   --  104  CO2 26  --   --   --  24  GLUCOSE 154*  --   --   --  128*  BUN 10  --   --   --  10  CREATININE 1.08*  --   --  0.96 0.76  CALCIUM 9.0  --   --   --  8.8*  MG  --   --   --  1.9  --   PHOS  --   --   --  4.0  --    GFR: Estimated Creatinine Clearance: 114.5 mL/min (by C-G formula based on SCr of 0.76 mg/dL). Recent Labs  Lab 03/22/22 0857 03/22/22 1148 03/22/22 1423  PROCALCITON  --  <0.10  --   WBC 21.2* 22.3*  --   LATICACIDVEN 1.5 1.3 2.6*    Liver Function Tests: No results for input(s): "AST", "ALT", "ALKPHOS", "BILITOT", "PROT", "ALBUMIN" in the last 168 hours. No results for input(s): "LIPASE", "AMYLASE" in the last 168 hours. No results for input(s): "AMMONIA" in the last 168 hours.  ABG    Component Value Date/Time   PHART 7.275 (L) 03/22/2022 1022   PCO2ART 55.7 (H) 03/22/2022 1022   PO2ART 56 (L) 03/22/2022 1022   HCO3 25.8 03/22/2022 1022   TCO2 27 03/22/2022 1022   ACIDBASEDEF 2.0 03/22/2022 1022   O2SAT  84 03/22/2022 1022     Coagulation Profile: No results for input(s): "INR", "PROTIME" in the last 168 hours.  Cardiac Enzymes: No results for input(s): "CKTOTAL", "CKMB", "CKMBINDEX", "TROPONINI" in the last 168 hours.  HbA1C: No results found for: "HGBA1C"  CBG: Recent Labs  Lab 03/22/22 1317  GLUCAP 131*    Review of Systems:   Cough, pleuritic chest pain.   Past Medical History:  She,  has a past medical history of Acid reflux, Anxiety, Arthritis, Complication of  anesthesia, Constipation due to pain medication, Depression, Hepatitis C, and Hypertension.   Surgical History:   Past Surgical History:  Procedure Laterality Date   ABDOMINAL HYSTERECTOMY     APPLICATION OF WOUND VAC Right 09/18/2018   Procedure: Application Of Wound Vac;  Surgeon: Newt Minion, MD;  Location: [facility];  Service: Orthopedics;  Laterality: Right;   CHOLECYSTECTOMY     TOTAL KNEE ARTHROPLASTY Right 09/18/2018   Procedure: RIGHT TOTAL KNEE ARTHROPLASTY;  Surgeon: Newt Minion, MD;  Location: [place];  Service: Orthopedics;  Laterality: Right;     Social History:   reports that she quit smoking about 3 years ago. Her smoking use included cigarettes. She smoked an average of .5 packs per day. She has never used smokeless tobacco. She reports that she does not drink alcohol and does not use drugs.   Family History:  Her family history is not on file.   Allergies Allergies  Allergen Reactions   Celebrex [Celecoxib] Hives and Nausea Only   Naprosyn [Naproxen] Rash and Other (See Comments)    Severe headache   Penicillins Hives, Nausea And Vomiting and Rash    Has patient had a PCN reaction causing immediate rash, facial/tongue/throat swelling, SOB or lightheadedness with hypotension: Unknown Has patient had a PCN reaction causing severe rash involving mucus membranes or skin necrosis: Unknown Has patient had a PCN reaction that required hospitalization: Unknown Has patient had a PCN reaction  occurring within the last 10 years: Yes - has had  If all of the above answers are "NO", then may proceed with Cephalosporin use.    Prednisone Hives, Nausea Only and Other (See Comments)    Agitation    Ultram [Tramadol] Nausea And Vomiting and Other (See Comments)    Severe headache   Hydrocodone-Ibuprofen Nausea And Vomiting and Rash   Motrin [Ibuprofen] Other (See Comments)    Stomach irritation     Home Medications  Prior to Admission medications   Medication Sig Start Date End Date Taking? Authorizing Provider  FLUoxetine (PROZAC) 10 MG capsule Take 10 mg by mouth daily.   Yes [provider]  clindamycin (CLEOCIN) 150 MG capsule Take 1 capsule (150 mg total) by mouth every 6 (six) hours. Patient not taking: Reported on 03/22/2022 01/19/22   Hayden Rasmussen, MD     Critical care time: 20

## 2022-03-23 NOTE — Discharge Instructions (Signed)
You were hospitalized for drug overdose. Thank you for allowing [place] to be part of your care.   Please call to arrange follow up at: [facility]  [phone]  Please call our clinic if you have any questions or concerns, we may be able to help and keep you from a long and expensive emergency room wait. Our clinic and after hours phone number is [phone], the best time to call is Monday through Friday 9 am to 4 pm but there is always someone available 24/7 if you have an emergency. If you need medication refills please notify your pharmacy one week in advance and they will send [place] a request.

## 2022-03-23 NOTE — Discharge Summary (Signed)
Resident Discharge Summary  Patient ID: Cheryl Fox MRN: [id number] DOB/AGE: Nov 16, 1980 41 y.o.  Admit date: 03/22/2022 Discharge date: 03/23/2022  Problem List Principal Problem:   Drug overdose  HPI: 41 year old female who was found with her partner gas station bathroom unconscious.  Received multiple doses of Narcan with improved mental status but still narcotize.  As reported she was doing fentanyl and cocaine.  It was reported that she vomited while being bagged by EMS.  Chest x-ray is unremarkable.  She will be admitted to the intensive care unit placed on a Narcan drip supplemental oxygen will be supplied and should be continuously evaluated for at least 24 hours.  Hospital Course: Acute hypercarbic/hypoxic respiratory failure 2/2 opiate overdose UTI Admitted after using cocaine likely laced with fentanyl. No history of opiate use disorder. Likely mild underlying lung disease with 10-11 pack year smoking history, currently 1+ ppd. Concern initially for aspiration pneumonia, leukocytosis worsening,no fever, procal neg. CXR clear. Blood/urine cx pending, no pre abx UA, hx of E. Coli in urine, given levaquin on admission. Responded well to narcan drip and supplemental O2 [place].  On her second day of admission she was able to come completely off of Narcan and supplemental O2 without any issues.  She was able to walk around the unit without any supplemental oxygen and no oxygen saturation drops.  She was discharged with a prescription for Keflex to complete a 5-day course of antibiotics.   Substance abuse Tobacco Use Disorder Depression/Grief Use of cocaine and increased tobacco use as a coping mechanism after losing grandmother, mother, and brother recently. Has a plan for counseling outpatient, shows initiative to address this after discharge.  Fluoxetine 10 mg was on her medication list however she states that she is never taken this. May still need psychiatric evaluation if determined not to  be within the realm of normal grief reaction, this will need further investigation at follow-up.   Hypertension Pt was previously on amlodipine 5 mg but stopped taking this herself a while ago. Pressures are high here but this is clouded by cocaine use and acute presentation.  Her blood pressure did come down to normotensive during the day prior to discharge.  Recommend follow-up blood pressure at primary care.  Labs at discharge Lab Results  Component Value Date   CREATININE 0.76 03/23/2022   BUN 10 03/23/2022   NA 136 03/23/2022   K 4.0 03/23/2022   CL 104 03/23/2022   CO2 24 03/23/2022   Lab Results  Component Value Date   WBC 25.5 (H) 03/23/2022   HGB 12.3 03/23/2022   HCT 38.1 03/23/2022   MCV 82.1 03/23/2022   PLT 194 03/23/2022   Lab Results  Component Value Date   ALT 13 01/19/2022   AST 16 01/19/2022   ALKPHOS 63 01/19/2022   BILITOT 0.6 01/19/2022   Lab Results  Component Value Date   INR 1.0 01/19/2022    Current radiology studies DG Chest Port 1 View  Result Date: 03/22/2022 CLINICAL DATA:  Aspiration, drug overdose. EXAM: PORTABLE CHEST 1 VIEW COMPARISON:  01/19/2022 FINDINGS: Low lung volumes are present, causing crowding of the pulmonary vasculature. No airspace opacity identified. Old healed right rib fractures. Cardiac and mediastinal margins appear normal. No blunting of the costophrenic angles. IMPRESSION: 1. No current specific radiographic evidence of pulmonary aspiration. 2. Low lung volumes are present, causing crowding of the pulmonary vasculature. Electronically Signed   By: Gaylyn Rong M.D.   On: 03/22/2022 09:27  Disposition:  Discharge disposition: 01-Home or Self Care       Discharge Instructions     Call MD for:  difficulty breathing, headache or visual disturbances   Complete by: As directed    Call MD for:  persistant dizziness or light-headedness   Complete by: As directed    Call MD for:  persistant nausea and vomiting    Complete by: As directed    Call MD for:  severe uncontrolled pain   Complete by: As directed    Call MD for:  temperature >100.4   Complete by: As directed    Diet general   Complete by: As directed    Increase activity slowly   Complete by: As directed       Allergies as of 03/23/2022       Reactions   Celebrex [celecoxib] Hives, Nausea Only   Naprosyn [naproxen] Rash, Other (See Comments)   Severe headache   Penicillins Hives, Nausea And Vomiting, Rash   Has patient had a PCN reaction causing immediate rash, facial/tongue/throat swelling, SOB or lightheadedness with hypotension: Unknown Has patient had a PCN reaction causing severe rash involving mucus membranes or skin necrosis: Unknown Has patient had a PCN reaction that required hospitalization: Unknown Has patient had a PCN reaction occurring within the last 10 years: Yes - has had  If all of the above answers are "NO", then may proceed with Cephalosporin use.   Prednisone Hives, Nausea Only, Other (See Comments)   Agitation    Ultram [tramadol] Nausea And Vomiting, Other (See Comments)   Severe headache   Hydrocodone-ibuprofen Nausea And Vomiting, Rash   Motrin [ibuprofen] Other (See Comments)   Stomach irritation        Medication List     STOP taking these medications    clindamycin 150 MG capsule Commonly known as: CLEOCIN   FLUoxetine 10 MG capsule Commonly known as: PROZAC       TAKE these medications    cephALEXin 500 MG capsule Commonly known as: KEFLEX Take 1 capsule (500 mg total) by mouth every 8 (eight) hours for 4 days.        Follow-up Information     [facility]. Call today.   Contact information: [address] [place] [address] [phone]                 Discharged Condition: good  Time spent on discharge greater than 40 minutes.  Vital signs at Discharge. Temp:  [97.9 F (36.6 C)-98.6 F (37 C)] 98.1 F (36.7 C)  (09/14 0323) Pulse Rate:  [99-115] 110 (09/14 1035) Resp:  [13-25] 16 (09/14 1035) BP: (132-186)/(78-121) 139/83 (09/14 1035) SpO2:  [85 %-100 %] 94 % (09/14 1035) Weight:  [109 kg] 109 kg (09/14 0500)  Signed: Rocky Morel, DO Internal Medicine, PGY-1 03/23/2022, 11:45 AM See Amion for pager If no response to pager, please call [phone] until 1900 After 1900 please call [facility] [phone]

## 2022-03-23 NOTE — Progress Notes (Signed)
Pt belongings bag contains cell phone, clothing, cigarettes, lighters, spare change, and a wallet.

## 2022-03-27 LAB — CULTURE, BLOOD (ROUTINE X 2)
Culture: NO GROWTH
Culture: NO GROWTH
Culture: NO GROWTH
Special Requests: ADEQUATE
Special Requests: ADEQUATE

## 2022-04-11 ENCOUNTER — Ambulatory Visit: Payer: Self-pay | Admitting: Family Medicine

## 2022-08-30 ENCOUNTER — Ambulatory Visit: Payer: Self-pay | Admitting: Family

## 2023-02-09 ENCOUNTER — Ambulatory Visit: Payer: Medicaid Other | Admitting: Orthopaedic Surgery

## 2023-02-15 ENCOUNTER — Ambulatory Visit: Payer: Medicaid Other | Admitting: Orthopedic Surgery

## 2023-08-02 ENCOUNTER — Ambulatory Visit: Payer: Self-pay | Admitting: Orthopaedic Surgery

## 2023-08-06 ENCOUNTER — Ambulatory Visit: Payer: Medicaid Other | Admitting: Orthopedic Surgery

## 2024-05-12 ENCOUNTER — Encounter: Payer: Self-pay | Admitting: Radiology
